# Patient Record
Sex: Male | Born: 1945
Health system: Southern US, Community
[De-identification: ages and names within clinical notes are randomized; demographics above are authoritative.]

## PROBLEM LIST (undated history)

## (undated) DIAGNOSIS — K219 Gastro-esophageal reflux disease without esophagitis: Secondary | ICD-10-CM

## (undated) DIAGNOSIS — K449 Diaphragmatic hernia without obstruction or gangrene: Secondary | ICD-10-CM

## (undated) DIAGNOSIS — N138 Other obstructive and reflux uropathy: Secondary | ICD-10-CM

## (undated) DIAGNOSIS — M5136 Other intervertebral disc degeneration, lumbar region: Secondary | ICD-10-CM

## (undated) DIAGNOSIS — G4730 Sleep apnea, unspecified: Secondary | ICD-10-CM

## (undated) DIAGNOSIS — H269 Unspecified cataract: Secondary | ICD-10-CM

## (undated) DIAGNOSIS — M1990 Unspecified osteoarthritis, unspecified site: Secondary | ICD-10-CM

## (undated) DIAGNOSIS — C61 Malignant neoplasm of prostate: Secondary | ICD-10-CM

## (undated) DIAGNOSIS — R252 Cramp and spasm: Secondary | ICD-10-CM

## (undated) DIAGNOSIS — M51369 Other intervertebral disc degeneration, lumbar region without mention of lumbar back pain or lower extremity pain: Secondary | ICD-10-CM

## (undated) DIAGNOSIS — N401 Enlarged prostate with lower urinary tract symptoms: Secondary | ICD-10-CM

## (undated) DIAGNOSIS — C801 Malignant (primary) neoplasm, unspecified: Secondary | ICD-10-CM

## (undated) DIAGNOSIS — R12 Heartburn: Secondary | ICD-10-CM

## (undated) DIAGNOSIS — E291 Testicular hypofunction: Secondary | ICD-10-CM

## (undated) HISTORY — PX: OTHER SURGICAL HISTORY: SHX169

## (undated) HISTORY — DX: Sleep apnea, unspecified: G47.30

## (undated) HISTORY — PX: APPENDECTOMY: SHX54

## (undated) HISTORY — PX: TURP VAPORIZATION: SUR1397

## (undated) HISTORY — DX: Other obstructive and reflux uropathy: N40.1

## (undated) HISTORY — DX: Testicular hypofunction: E29.1

## (undated) HISTORY — PX: PROSTATE SURGERY: SHX751

## (undated) HISTORY — DX: Unspecified osteoarthritis, unspecified site: M19.90

## (undated) HISTORY — DX: Other obstructive and reflux uropathy: N13.8

## (undated) HISTORY — PX: TONSILLECTOMY: SUR1361

## (undated) HISTORY — DX: Heartburn: R12

---

## 2013-11-08 DIAGNOSIS — K219 Gastro-esophageal reflux disease without esophagitis: Secondary | ICD-10-CM | POA: Insufficient documentation

## 2014-01-24 DIAGNOSIS — M545 Low back pain, unspecified: Secondary | ICD-10-CM | POA: Insufficient documentation

## 2014-05-21 ENCOUNTER — Ambulatory Visit: Payer: Self-pay | Admitting: Internal Medicine

## 2014-06-09 DIAGNOSIS — M5116 Intervertebral disc disorders with radiculopathy, lumbar region: Secondary | ICD-10-CM | POA: Insufficient documentation

## 2014-06-09 DIAGNOSIS — M48061 Spinal stenosis, lumbar region without neurogenic claudication: Secondary | ICD-10-CM | POA: Insufficient documentation

## 2014-11-14 DIAGNOSIS — M5136 Other intervertebral disc degeneration, lumbar region: Secondary | ICD-10-CM | POA: Insufficient documentation

## 2015-01-06 ENCOUNTER — Other Ambulatory Visit: Payer: Self-pay | Admitting: Unknown Physician Specialty

## 2015-01-06 DIAGNOSIS — K219 Gastro-esophageal reflux disease without esophagitis: Secondary | ICD-10-CM

## 2015-01-06 DIAGNOSIS — R0989 Other specified symptoms and signs involving the circulatory and respiratory systems: Secondary | ICD-10-CM

## 2015-01-08 ENCOUNTER — Ambulatory Visit
Admission: RE | Admit: 2015-01-08 | Discharge: 2015-01-08 | Disposition: A | Discharge status: Home / Self Care | Payer: Medicare Other | Source: Ambulatory Visit | Attending: Unknown Physician Specialty | Admitting: Unknown Physician Specialty

## 2015-01-08 DIAGNOSIS — K228 Other specified diseases of esophagus: Secondary | ICD-10-CM | POA: Diagnosis not present

## 2015-01-08 DIAGNOSIS — K219 Gastro-esophageal reflux disease without esophagitis: Secondary | ICD-10-CM | POA: Diagnosis not present

## 2015-01-08 DIAGNOSIS — K449 Diaphragmatic hernia without obstruction or gangrene: Secondary | ICD-10-CM | POA: Insufficient documentation

## 2015-01-08 DIAGNOSIS — J387 Other diseases of larynx: Secondary | ICD-10-CM | POA: Diagnosis present

## 2015-01-08 DIAGNOSIS — R0989 Other specified symptoms and signs involving the circulatory and respiratory systems: Secondary | ICD-10-CM

## 2015-02-11 ENCOUNTER — Encounter: Payer: Self-pay | Admitting: *Deleted

## 2015-02-12 ENCOUNTER — Ambulatory Visit: Payer: Medicare Other | Admitting: Anesthesiology

## 2015-02-12 ENCOUNTER — Ambulatory Visit
Admission: RE | Admit: 2015-02-12 | Discharge: 2015-02-12 | Disposition: A | Discharge status: Home / Self Care | Payer: Medicare Other | Source: Ambulatory Visit | Attending: Gastroenterology | Admitting: Gastroenterology

## 2015-02-12 ENCOUNTER — Encounter: Payer: Self-pay | Admitting: Anesthesiology

## 2015-02-12 ENCOUNTER — Encounter
Admission: RE | Disposition: A | Discharge status: Home / Self Care | Payer: Self-pay | Source: Ambulatory Visit | Attending: Gastroenterology

## 2015-02-12 DIAGNOSIS — Z7982 Long term (current) use of aspirin: Secondary | ICD-10-CM | POA: Insufficient documentation

## 2015-02-12 DIAGNOSIS — R1310 Dysphagia, unspecified: Secondary | ICD-10-CM | POA: Diagnosis present

## 2015-02-12 DIAGNOSIS — Z8546 Personal history of malignant neoplasm of prostate: Secondary | ICD-10-CM | POA: Insufficient documentation

## 2015-02-12 DIAGNOSIS — R252 Cramp and spasm: Secondary | ICD-10-CM | POA: Diagnosis not present

## 2015-02-12 DIAGNOSIS — Z91048 Other nonmedicinal substance allergy status: Secondary | ICD-10-CM | POA: Diagnosis not present

## 2015-02-12 DIAGNOSIS — K2960 Other gastritis without bleeding: Secondary | ICD-10-CM | POA: Insufficient documentation

## 2015-02-12 DIAGNOSIS — K219 Gastro-esophageal reflux disease without esophagitis: Secondary | ICD-10-CM | POA: Diagnosis not present

## 2015-02-12 DIAGNOSIS — K449 Diaphragmatic hernia without obstruction or gangrene: Secondary | ICD-10-CM | POA: Insufficient documentation

## 2015-02-12 DIAGNOSIS — Z883 Allergy status to other anti-infective agents status: Secondary | ICD-10-CM | POA: Diagnosis not present

## 2015-02-12 DIAGNOSIS — M1990 Unspecified osteoarthritis, unspecified site: Secondary | ICD-10-CM | POA: Insufficient documentation

## 2015-02-12 DIAGNOSIS — R12 Heartburn: Secondary | ICD-10-CM | POA: Insufficient documentation

## 2015-02-12 DIAGNOSIS — Z888 Allergy status to other drugs, medicaments and biological substances status: Secondary | ICD-10-CM | POA: Diagnosis not present

## 2015-02-12 DIAGNOSIS — K228 Other specified diseases of esophagus: Secondary | ICD-10-CM | POA: Insufficient documentation

## 2015-02-12 HISTORY — DX: Unspecified cataract: H26.9

## 2015-02-12 HISTORY — DX: Gastro-esophageal reflux disease without esophagitis: K21.9

## 2015-02-12 HISTORY — DX: Malignant neoplasm of prostate: C61

## 2015-02-12 HISTORY — DX: Malignant (primary) neoplasm, unspecified: C80.1

## 2015-02-12 HISTORY — DX: Unspecified osteoarthritis, unspecified site: M19.90

## 2015-02-12 HISTORY — DX: Cramp and spasm: R25.2

## 2015-02-12 HISTORY — PX: ESOPHAGOGASTRODUODENOSCOPY (EGD) WITH PROPOFOL: SHX5813

## 2015-02-12 SURGERY — ESOPHAGOGASTRODUODENOSCOPY (EGD) WITH PROPOFOL
Anesthesia: General

## 2015-02-12 MED ORDER — SODIUM CHLORIDE 0.9 % IV SOLN
INTRAVENOUS | Status: DC
  Administered 2015-02-12: 1000 mL via INTRAVENOUS

## 2015-02-12 MED ORDER — SODIUM CHLORIDE 0.9 % IV SOLN: INTRAVENOUS | Status: DC

## 2015-02-12 MED ORDER — ONDANSETRON HCL 4 MG/2ML IJ SOLN: 4.0000 mg | Freq: Once | INTRAMUSCULAR | Status: DC | PRN

## 2015-02-12 MED ORDER — PROPOFOL 10 MG/ML IV BOLUS
INTRAVENOUS | Status: DC | PRN
  Administered 2015-02-12: 50 mg via INTRAVENOUS

## 2015-02-12 MED ORDER — PROPOFOL INFUSION 10 MG/ML OPTIME
INTRAVENOUS | Status: DC | PRN
  Administered 2015-02-12: 140 ug/kg/min via INTRAVENOUS

## 2015-02-12 NOTE — OR Nursing (Signed)
Patient dilated with 51 savory dilator.  No heme present.

## 2015-02-12 NOTE — Anesthesia Postprocedure Evaluation (Signed)
  Anesthesia Post-op Note  Patient: Alexander Woods  Procedure(s) Performed: Procedure(s): ESOPHAGOGASTRODUODENOSCOPY (EGD) WITH PROPOFOL (N/A)  Anesthesia type:General  Patient location: PACU  Post pain: Pain level controlled  Post assessment: Post-op Vital signs reviewed, Patient's Cardiovascular Status Stable, Respiratory Function Stable, Patent Airway and No signs of Nausea or vomiting  Post vital signs: Reviewed and stable  Last Vitals:  Filed Vitals:   02/12/15 1010  BP: 129/87  Pulse: 56  Temp:   Resp: 16    Level of consciousness: awake, alert  and patient cooperative  Complications: No apparent anesthesia complications

## 2015-02-12 NOTE — Anesthesia Procedure Notes (Signed)
Date/Time: 02/12/2015 9:16 AM Performed by: Doreen Salvage Pre-anesthesia Checklist: Patient identified, Emergency Drugs available, Suction available and Patient being monitored Patient Re-evaluated:Patient Re-evaluated prior to inductionOxygen Delivery Method: Nasal cannula

## 2015-02-12 NOTE — H&P (Signed)
Primary Care Physician:  Glendon Axe, MD  Pre-Procedure History & Physical: HPI:  Alexander Woods is a 69 y.o. male is here for an endoscopy.   Past Medical History  Diagnosis Date  . GERD (gastroesophageal reflux disease)   . Cancer   . Prostate cancer   . DJD (degenerative joint disease)   . Leg cramps   . Cataract     Past Surgical History  Procedure Laterality Date  . Appendectomy    . Turp vaporization    . Incision tendon sheath for trigger finger    . Plantar fascitis     . Bleretheplasty    . Tonsillectomy    . Prostate surgery      Prior to Admission medications   Medication Sig Start Date End Date Taking? Authorizing Provider  ascorbic acid (VITAMIN C) 1000 MG tablet Take 1,000 mg by mouth daily.   Yes Historical Provider, MD  aspirin 81 MG tablet Take 81 mg by mouth daily.   Yes Historical Provider, MD  calcium carbonate (TUMS - DOSED IN MG ELEMENTAL CALCIUM) 500 MG chewable tablet Chew 1 tablet by mouth daily.   Yes Historical Provider, MD  calcium-vitamin D (OSCAL WITH D) 500-200 MG-UNIT per tablet Take 1 tablet by mouth.   Yes Historical Provider, MD  Co-Enzyme Q-10 100 MG CAPS Take by mouth.   Yes Historical Provider, MD  EPINEPHrine 0.3 mg/0.3 mL IJ SOAJ injection Inject 0.3 mg into the muscle once.   Yes Historical Provider, MD  gabapentin (NEURONTIN) 300 MG capsule Take 300 mg by mouth 3 (three) times daily.   Yes Historical Provider, MD  omeprazole (PRILOSEC) 40 MG capsule Take 40 mg by mouth daily.   Yes Historical Provider, MD  Potassium Chloride CR (MICRO-K) 8 MEQ CPCR capsule CR Take 8 mEq by mouth.   Yes Historical Provider, MD  pyridoxine (B-6) 100 MG tablet Take 100 mg by mouth daily.   Yes Historical Provider, MD  Specialty Vitamins Products (MAGNESIUM, AMINO ACID CHELATE,) 133 MG tablet Take 1 tablet by mouth 2 (two) times daily.   Yes Historical Provider, MD  tiZANidine (ZANAFLEX) 4 MG capsule Take 4 mg by mouth 3 (three) times daily.   Yes  Historical Provider, MD  vitamin B-12 (CYANOCOBALAMIN) 1000 MCG tablet Take 1,000 mcg by mouth daily.   Yes Historical Provider, MD    Allergies as of 01/26/2015  . (Not on File)    History reviewed. No pertinent family history.  Social History   Social History  . Marital Status: Married    Spouse Name: N/A  . Number of Children: N/A  . Years of Education: N/A   Occupational History  . Not on file.   Social History Main Topics  . Smoking status: Not on file  . Smokeless tobacco: Not on file  . Alcohol Use: Not on file  . Drug Use: Not on file  . Sexual Activity: Not on file   Other Topics Concern  . Not on file   Social History Narrative     Physical Exam: BP 132/96 mmHg  Pulse 75  Temp(Src) 99 F (37.2 C) (Tympanic)  Resp 16  Ht 5\' 11"  (1.803 m)  Wt 90.719 kg (200 lb)  BMI 27.91 kg/m2  SpO2 99% General:   Alert,  pleasant and cooperative in NAD Head:  Normocephalic and atraumatic. Neck:  Supple; no masses or thyromegaly. Lungs:  Clear throughout to auscultation.    Heart:  Regular rate and rhythm. Abdomen:  Soft, nontender  and nondistended. Normal bowel sounds, without guarding, and without rebound.   Neurologic:  Alert and  oriented x4;  grossly normal neurologically.  Impression/Plan: Alexander Woods is here for an endoscopy to be performed for dysphagia, GERD  Risks, benefits, limitations, and alternatives regarding  endoscopy have been reviewed with the patient.  Questions have been answered.  All parties agreeable.   Josefine Class, MD  02/12/2015, 9:14 AM

## 2015-02-12 NOTE — Transfer of Care (Signed)
Immediate Anesthesia Transfer of Care Note  Patient: Alexander Woods  Procedure(s) Performed: Procedure(s): ESOPHAGOGASTRODUODENOSCOPY (EGD) WITH PROPOFOL (N/A)  Patient Location: PACU and Endoscopy Unit  Anesthesia Type:General  Level of Consciousness: sedated  Airway & Oxygen Therapy: Patient Spontanous Breathing and Patient connected to nasal cannula oxygen  Post-op Assessment: Report given to RN and Post -op Vital signs reviewed and stable  Post vital signs: Reviewed and stable  Last Vitals:  Filed Vitals:   02/12/15 0940  BP: 115/74  Pulse: 59  Temp: 36.6 C  Resp: 14    Complications: No apparent anesthesia complications

## 2015-02-12 NOTE — Op Note (Signed)
[facility] Gastroenterology Patient Name: Alexander Woods Procedure Date: 02/12/2015 9:12 AM MRN: [id number] Account #: [id number] Date of Birth: 11-23-1945 Admit Type: Outpatient Age: 69 Room: [facility] ENDO ROOM 3 Gender: Male Note Status: Finalized Procedure:         Upper GI endoscopy Indications:       Dysphagia, Heartburn, Suspected esophageal reflux Patient Profile:   This is a 69 year old male. Providers:         Gerrit Heck. Rayann Heman, MD Referring MD:      Glendon Axe (Referring MD) Medicines:         Propofol per Anesthesia Complications:     No immediate complications. Procedure:         Pre-Anesthesia Assessment:                    - Prior to the procedure, a History and Physical was                     performed, and patient medications, allergies and                     sensitivities were reviewed. The patient's tolerance of                     previous anesthesia was reviewed.                    After obtaining informed consent, the endoscope was passed                     under direct vision. Throughout the procedure, the                     patient's blood pressure, pulse, and oxygen saturations                     were monitored continuously. The Olympus GIF-160 endoscope                     (S#. [id number]) was introduced through the mouth, and                     advanced to the second part of duodenum. The upper GI                     endoscopy was accomplished without difficulty. The patient                     tolerated the procedure well. Findings:      A medium-sized hiatus hernia was present. Estimated blood loss: none.      The examined esophagus was moderately tortuous.      A guidewire was placed and the scope was withdrawn. Dilation was       performed in the entire esophagus with a Savary dilator with no       resistance at 51 Fr.      Biopsies were taken with a cold forceps in the lower third of the       esophagus for histology.  Localized moderate mucosal changes characterized by nodularity were       found in the gastric antrum. Biopsies were taken with a cold forceps for       histology.      The examined duodenum was normal. Impression:        -  Medium-sized hiatus hernia.                    - Tortuous esophagus.                    - Nodular mucosa in the antrum. Biopsied.                    - Normal examined duodenum.                    - Dilation attempted in the entire esophagus. Successful.                    - Biopsies were taken with a cold forceps for histology in                     the lower third of the esophagus.                    - Suspect the reflux and dysphagia all related to the                     hiatal hernia. Recommendation:    - Observe patient in GI recovery unit.                    - Resume regular diet.                    - Continue present medications.                    - Use Protonix (pantoprazole) 40 mg PO daily. Stop                     prilosec.                    - Await pathology results.                    - Perform esophageal manomettry and 24 hour pH study.                    - Consider hernina repair if pH study confirms GERD and                     manometry is favorable and no improvement on protonix.                    - Return to GI clinic.                    - The findings and recommendations were discussed with the                     patient.                    - The findings and recommendations were discussed with the                     patient's family. Procedure Code(s): --- Professional ---                    [phone], Esophagogastroduodenoscopy, flexible, transoral;                     with insertion of guide wire followed by  passage of                     dilator(s) through esophagus over guide wire                    [id number], Esophagogastroduodenoscopy, flexible, transoral;                     with biopsy, single or multiple CPT copyright 2014 American  Medical Association. All rights reserved. The codes documented in this report are preliminary and upon coder review may  be revised to meet current compliance requirements. Mellody Life, MD 02/12/2015 9:33:46 AM This report has been signed electronically. Number of Addenda: 0 Note Initiated On: 02/12/2015 9:12 AM      [facility]

## 2015-02-12 NOTE — Discharge Instructions (Signed)

## 2015-02-12 NOTE — Anesthesia Preprocedure Evaluation (Signed)
Anesthesia Evaluation  Patient identified by MRN, date of birth, ID band Patient awake    Reviewed: Allergy & Precautions, NPO status , Patient's Chart, lab work & pertinent test results  Airway Mallampati: II  TM Distance: >3 FB     Dental  (+) Caps Top front caps:   Pulmonary neg pulmonary ROS,    Pulmonary exam normal breath sounds clear to auscultation       Cardiovascular Normal cardiovascular exam     Neuro/Psych negative neurological ROS  negative psych ROS   GI/Hepatic Neg liver ROS, GERD  Medicated and Controlled,dysphagia   Endo/Other  negative endocrine ROS  Renal/GU negative Renal ROS     Musculoskeletal  (+) Arthritis , Osteoarthritis,    Abdominal Normal abdominal exam  (+)   Peds negative pediatric ROS (+)  Hematology negative hematology ROS (+)   Anesthesia Other Findings Prostate CA  Reproductive/Obstetrics                             Anesthesia Physical Anesthesia Plan  ASA: II  Anesthesia Plan: General   Post-op Pain Management:    Induction: Intravenous  Airway Management Planned: Nasal Cannula  Additional Equipment:   Intra-op Plan:   Post-operative Plan:   Informed Consent: I have reviewed the patients History and Physical, chart, labs and discussed the procedure including the risks, benefits and alternatives for the proposed anesthesia with the patient or authorized representative who has indicated his/her understanding and acceptance.   Dental advisory given  Plan Discussed with: CRNA and Surgeon  Anesthesia Plan Comments:         Anesthesia Quick Evaluation

## 2015-02-13 ENCOUNTER — Encounter: Payer: Self-pay | Admitting: Gastroenterology

## 2015-02-16 LAB — SURGICAL PATHOLOGY

## 2015-03-31 DIAGNOSIS — Z8639 Personal history of other endocrine, nutritional and metabolic disease: Secondary | ICD-10-CM | POA: Insufficient documentation

## 2015-04-14 ENCOUNTER — Encounter: Payer: Self-pay | Admitting: *Deleted

## 2015-04-15 ENCOUNTER — Encounter
Admission: RE | Disposition: A | Discharge status: Home / Self Care | Payer: Self-pay | Source: Ambulatory Visit | Attending: Gastroenterology

## 2015-04-15 ENCOUNTER — Ambulatory Visit
Admission: RE | Admit: 2015-04-15 | Discharge: 2015-04-15 | Disposition: A | Discharge status: Home / Self Care | Payer: Medicare Other | Source: Ambulatory Visit | Attending: Gastroenterology | Admitting: Gastroenterology

## 2015-04-15 DIAGNOSIS — R1310 Dysphagia, unspecified: Secondary | ICD-10-CM | POA: Diagnosis not present

## 2015-04-15 DIAGNOSIS — Z79899 Other long term (current) drug therapy: Secondary | ICD-10-CM | POA: Insufficient documentation

## 2015-04-15 DIAGNOSIS — K219 Gastro-esophageal reflux disease without esophagitis: Secondary | ICD-10-CM | POA: Diagnosis not present

## 2015-04-15 DIAGNOSIS — R1110 Vomiting, unspecified: Secondary | ICD-10-CM | POA: Insufficient documentation

## 2015-04-15 HISTORY — DX: Other intervertebral disc degeneration, lumbar region: M51.36

## 2015-04-15 HISTORY — PX: ESOPHAGEAL MANOMETRY: SHX5429

## 2015-04-15 HISTORY — PX: 24 HOUR PH STUDY: SHX5419

## 2015-04-15 HISTORY — DX: Other intervertebral disc degeneration, lumbar region without mention of lumbar back pain or lower extremity pain: M51.369

## 2015-04-15 HISTORY — DX: Diaphragmatic hernia without obstruction or gangrene: K44.9

## 2015-04-15 SURGERY — MANOMETRY, ESOPHAGUS
Anesthesia: General

## 2015-04-15 MED ORDER — LIDOCAINE HCL 2 % EX GEL
CUTANEOUS | Status: AC
  Administered 2015-04-15: 3
  Filled 2015-04-15: qty 5

## 2015-04-15 MED ORDER — BUTAMBEN-TETRACAINE-BENZOCAINE 2-2-14 % EX AERO
INHALATION_SPRAY | CUTANEOUS | Status: AC
  Administered 2015-04-15: 1 via TOPICAL
  Filled 2015-04-15: qty 20

## 2015-04-15 MED ORDER — BUTAMBEN-TETRACAINE-BENZOCAINE 2-2-14 % EX AERO
1.0000 | INHALATION_SPRAY | Freq: Once | CUTANEOUS | Status: AC
  Administered 2015-04-15: 1 via TOPICAL

## 2015-04-15 MED ORDER — LIDOCAINE HCL 2 % EX GEL
1.0000 "application " | Freq: Once | CUTANEOUS | Status: AC
  Administered 2015-04-15: 3

## 2015-04-15 SURGICAL SUPPLY — 2 items
FACESHIELD LNG OPTICON STERILE (SAFETY) IMPLANT
GLOVE BIO SURGEON STRL SZ8 (GLOVE) ×6 IMPLANT

## 2015-04-20 ENCOUNTER — Encounter: Payer: Self-pay | Admitting: Gastroenterology

## 2015-10-12 ENCOUNTER — Encounter: Payer: Self-pay | Admitting: *Deleted

## 2015-10-13 ENCOUNTER — Ambulatory Visit (INDEPENDENT_AMBULATORY_CARE_PROVIDER_SITE_OTHER): Payer: Medicare Other | Admitting: Urology

## 2015-10-13 ENCOUNTER — Encounter: Payer: Self-pay | Admitting: Urology

## 2015-10-13 VITALS — BP 120/78 | HR 75 | Ht 71.0 in | Wt 198.8 lb

## 2015-10-13 DIAGNOSIS — N401 Enlarged prostate with lower urinary tract symptoms: Secondary | ICD-10-CM | POA: Diagnosis not present

## 2015-10-13 DIAGNOSIS — N138 Other obstructive and reflux uropathy: Secondary | ICD-10-CM | POA: Insufficient documentation

## 2015-10-13 DIAGNOSIS — Z8546 Personal history of malignant neoplasm of prostate: Secondary | ICD-10-CM | POA: Diagnosis not present

## 2015-10-13 NOTE — Progress Notes (Signed)
10/13/2015 8:58 AM   Alexander Woods 1945/07/21 [id number]  Referring provider: Glendon Axe, MD [facility] [place], [place] [address]  Chief Complaint  Patient presents with  . Prostate Cancer    1 year follow up    HPI: Patient is a 70 year old Caucasian male with prostate cancer and BPH with LUTS who presents today for a yearly follow up.  History of prostate cancer Patient underwent TURP on 01/17/2007 by Dr. Tana Conch Mynatt at [facility] and 2 chips returned with focal adenocarcinoma, Gleason 2 + 3=5. Most recent PSA was 2.1 ng/mL on 10/17/2014.    BPH WITH LUTS His IPSS score today is 2, which is mild lower urinary tract symptomatology. He is delighted with his quality life due to his urinary symptoms.  He denies any dysuria, hematuria or suprapubic pain.  His has had TURP on 01/17/2007.   He also denies any recent fevers, chills, nausea or vomiting.  He does not have a family history of PCa.      IPSS      10/13/15 0800       International Prostate Symptom Score   How often have you had the sensation of not emptying your bladder? Not at All     How often have you had to urinate less than every two hours? Less than 1 in 5 times     How often have you found you stopped and started again several times when you urinated? Not at All     How often have you found it difficult to postpone urination? Not at All     How often have you had a weak urinary stream? Not at All     How often have you had to strain to start urination? Not at All     How many times did you typically get up at night to urinate? 1 Time     Total IPSS Score 2     Quality of Life due to urinary symptoms   If you were to spend the rest of your life with your urinary condition just the way it is now how would you feel about that? Delighted        Score:  1-7 Mild 8-19 Moderate 20-35 Severe     PMH: Past Medical History  Diagnosis Date  . GERD  (gastroesophageal reflux disease)   . Cancer ([place])   . Prostate cancer ([place])   . DJD (degenerative joint disease)   . Leg cramps   . Cataract   . DDD (degenerative disc disease), lumbar   . HH (hiatus hernia)   . Heartburn   . Arthritis   . Sleep apnea   . BPH with obstruction/lower urinary tract symptoms   . Hypogonadism in male     Surgical History: Past Surgical History  Procedure Laterality Date  . Appendectomy    . Turp vaporization    . Incision tendon sheath for trigger finger    . Plantar fascitis     . Bleretheplasty    . Tonsillectomy    . Prostate surgery    . Esophagogastroduodenoscopy (egd) with propofol N/A 02/12/2015    Procedure: ESOPHAGOGASTRODUODENOSCOPY (EGD) WITH PROPOFOL;  Surgeon: Josefine Class, MD;  [facility]COPY;  Service: Endoscopy;  Laterality: N/A;  . Esophageal manometry N/A 04/15/2015    Procedure: ESOPHAGEAL MANOMETRY (EM);  Surgeon: Josefine Class, MD;  Location: [facility] ENDOSCOPY;  Service: Endoscopy;  Laterality: N/A;  .  24 hour ph study N/A 04/15/2015    Procedure: 24 HOUR PH STUDY;  Surgeon: Josefine Class, MD;  Location: [facility] ENDOSCOPY;  Service: Endoscopy;  Laterality: N/A;    Home Medications:    Medication List       This list is accurate as of: 10/13/15  8:58 AM.  Always use your most recent med list.               ascorbic acid 1000 MG tablet  Commonly known as:  VITAMIN C  Take 1,000 mg by mouth daily.     aspirin 81 MG tablet  Take 81 mg by mouth daily.     calcium carbonate 500 MG chewable tablet  Commonly known as:  TUMS - dosed in mg elemental calcium  Chew 1 tablet by mouth daily.     calcium-vitamin D 500-200 MG-UNIT tablet  Commonly known as:  OSCAL WITH D  Take 1 tablet by mouth. Reported on 10/13/2015     Co-Enzyme Q-10 100 MG Caps  Take by mouth. Reported on 10/13/2015     EPINEPHrine 0.3 mg/0.3 mL Soaj injection  Commonly known as:  EPI-PEN  Inject 0.3 mg into the muscle once.      magnesium (amino acid chelate) 133 MG tablet  Take 1 tablet by mouth 2 (two) times daily. Reported on 10/13/2015     Magnesium 250 MG Tabs  Take by mouth.     MULTI-VITAMINS Tabs  Take by mouth.     NEURONTIN 300 MG capsule  Generic drug:  gabapentin  Take 300 mg by mouth 3 (three) times daily. Reported on 10/13/2015     OVER THE COUNTER MEDICATION  Cinsulin     pantoprazole 40 MG tablet  Commonly known as:  PROTONIX  Take by mouth. Reported on 10/13/2015     Potassium 99 MG Tabs  Take by mouth.     Potassium Chloride CR 8 MEQ Cpcr capsule CR  Commonly known as:  MICRO-K  Take 8 mEq by mouth. Reported on 10/13/2015     predniSONE 5 MG tablet  Commonly known as:  DELTASONE  Reported on 10/13/2015     PRILOSEC 40 MG capsule  Generic drug:  omeprazole  Take 40 mg by mouth daily. Reported on 10/13/2015     pyridoxine 100 MG tablet  Commonly known as:  B-6  Take 100 mg by mouth daily.     vitamin B-12 1000 MCG tablet  Commonly known as:  CYANOCOBALAMIN  Take 1,000 mcg by mouth daily.     Vitamin D3 2000 units capsule  Take by mouth.     ZANAFLEX 4 MG capsule  Generic drug:  tiZANidine  Take 4 mg by mouth 3 (three) times daily.        Allergies:  Allergies  Allergen Reactions  . Bacitracin   . Bee Venom Other (See Comments)  . Ciprofloxacin   . Erythromycin Ethylsuccinate   . Fentanyl Citrate   . Monosodium Glutamate     Family History: Family History  Problem Relation Age of Onset  . Stroke Father   . Heart disease Mother     Aortic Tear  . Heart failure Mother   . Kidney disease Neg Hx   . Prostate cancer Neg Hx     Social History:  reports that he has never smoked. He has never used smokeless tobacco. He reports that he does not drink alcohol or use illicit drugs.  ROS: UROLOGY Frequent Urination?: No Hard to  postpone urination?: No Burning/pain with urination?: No Get up at night to urinate?: Yes Leakage of urine?: No Urine stream starts and  stops?: No Trouble starting stream?: No Do you have to strain to urinate?: No Blood in urine?: No Urinary tract infection?: No Sexually transmitted disease?: No Injury to kidneys or bladder?: No Painful intercourse?: No Weak stream?: No Erection problems?: No Penile pain?: No  Gastrointestinal Nausea?: No Vomiting?: No Indigestion/heartburn?: Yes Diarrhea?: No Constipation?: No  Constitutional Fever: No Night sweats?: No Weight loss?: No Fatigue?: No  Skin Skin rash/lesions?: No Itching?: No  Eyes Blurred vision?: No Double vision?: No  Ears/Nose/Throat Sore throat?: No Sinus problems?: No  Hematologic/Lymphatic Swollen glands?: No Easy bruising?: No  Cardiovascular Leg swelling?: Yes Chest pain?: No  Respiratory Cough?: No Shortness of breath?: No  Endocrine Excessive thirst?: No  Musculoskeletal Back pain?: Yes Joint pain?: No  Neurological Headaches?: No Dizziness?: No  Psychologic Depression?: No Anxiety?: No  Physical Exam: BP 120/78 mmHg  Pulse 75  Ht 5\' 11"  (1.803 m)  Wt 198 lb 12.8 oz (90.175 kg)  BMI 27.74 kg/m2  Constitutional: Well nourished. Alert and oriented, No acute distress. HEENT: [place] AT, moist mucus membranes. Trachea midline, no masses. Cardiovascular: No clubbing, cyanosis, or edema. Respiratory: Normal respiratory effort, no increased work of breathing. GI: Abdomen is soft, non tender, non distended, no abdominal masses. Liver and spleen not palpable.  No hernias appreciated.  Stool sample for occult testing is not indicated.   GU: No CVA tenderness.  No bladder fullness or masses.  Patient with circumcised phallus.  Urethral meatus is patent.  No penile discharge. No penile lesions or rashes. Scrotum without lesions, cysts, rashes and/or edema.  Testicles are located scrotally bilaterally. No masses are appreciated in the testicles. Left and right epididymis are normal. Rectal: Patient with  normal sphincter tone.  Anus and perineum without scarring or rashes. No rectal masses are appreciated. Prostate is approximately 50 grams, no nodules are appreciated. Seminal vesicles are normal. Skin: No rashes, bruises or suspicious lesions. Lymph: No cervical or inguinal adenopathy. Neurologic: Grossly intact, no focal deficits, moving all 4 extremities. Psychiatric: Normal mood and affect.  Laboratory Data: PSA History:  6.5 ng/mL on 05/26/2005  5.3 ng/mL on 06/16/2005  3.7 ng/mL on 12/06/2006  1.2 ng/mL on 05/02/2007  1.6 ng/mL on 10/31/2007  1.3 ng/mL on 10/22/2008  1.6 ng/mL on 04/22/2009  1.8 ng/mL on 10/28/2009  1.8 ng/mL on 05/05/2010  1.8 ng/mL on 12/22/2011  1.8 ng/mL on 12/20/2012  2.0 ng/mL on 10/16/2013  2.1 ng/mL on 10/17/2014   Assessment & Plan:    1. History of prostate cancer:   Patient underwent TURP on 01/17/2007 by Dr. Tana Conch Mynatt at [facility] and 2 chips returned with focal adenocarcinoma, Gleason 2 + 3=5. Most recent PSA was 2.1 ng/mL on 10/17/2014.  If PSA remains stable, we will see him in one year.   - PSA  2. BPH with LUTS:    IPSS score is 2/0.  We will continue to monitor.  He will have his IPSS score, exam and PSA in 12 months if PSA remains stable.    Return in about 1 year (around 10/12/2016) for IPSS and exam.  These notes generated with voice recognition software. I apologize for typographical errors.  Zara Council, [facility] [address], [place], [place] [address] [phone]

## 2015-10-14 LAB — PSA: Prostate Specific Ag, Serum: 2.3 ng/mL (ref 0.0–4.0)

## 2015-10-15 ENCOUNTER — Telehealth: Payer: Self-pay

## 2015-10-15 NOTE — Telephone Encounter (Signed)
-----   Message from Nori Riis, PA-C sent at 10/14/2015  8:30 AM EDT ----- PSA is stable.  We will see him next year.

## 2015-10-15 NOTE — Telephone Encounter (Signed)
Spoke with pt in reference to PSA results. Pt voiced understanding.  

## 2015-10-19 ENCOUNTER — Encounter: Payer: Self-pay | Admitting: Podiatry

## 2015-10-19 ENCOUNTER — Ambulatory Visit (INDEPENDENT_AMBULATORY_CARE_PROVIDER_SITE_OTHER): Payer: Medicare Other

## 2015-10-19 ENCOUNTER — Ambulatory Visit (INDEPENDENT_AMBULATORY_CARE_PROVIDER_SITE_OTHER): Payer: Medicare Other | Admitting: Podiatry

## 2015-10-19 VITALS — BP 138/69 | HR 62 | Resp 12

## 2015-10-19 DIAGNOSIS — M79672 Pain in left foot: Secondary | ICD-10-CM

## 2015-10-19 DIAGNOSIS — G5762 Lesion of plantar nerve, left lower limb: Secondary | ICD-10-CM

## 2015-10-19 DIAGNOSIS — G5782 Other specified mononeuropathies of left lower limb: Secondary | ICD-10-CM

## 2015-10-19 NOTE — Progress Notes (Signed)
   Subjective:    Patient ID: Alexander Woods, male    DOB: 1945/11/27, 70 y.o.   MRN: [id number]  HPI: He presents today with year duration of pain to the fourth intermetatarsal space of the left foot. He states that he has been told that he had a neuroma and had a cortisone injection to the left foot which alleviated his symptoms for short period time but now it's back and excruciating. He states it hurts anytime that he is on his foot when he is off his foot he really doesn't notice the pain. He states that he does notice the tire the shoe gear the worst the pain is. He denies any trauma to the area.    Review of Systems  HENT: Positive for sinus pressure.   Cardiovascular: Positive for leg swelling.  Musculoskeletal: Positive for back pain and joint swelling.  Skin: Positive for color change.  Allergic/Immunologic: Positive for food allergies.       Objective:   Physical Exam: Vital signs are stable alert and oriented 3. Pulses are palpable. Neurologic sensorium is intact. Palpable Mulder's click with a mass noted between the fourth and fifth metatarsals of the left foot. Orthopedic evaluation demonstrates rectus foot type with a major osseous abnormalities. Deep tendon reflexes are intact and muscle strength is normal. Radiographs do not demonstrates any type of osseus abnormalities in this area.        Assessment & Plan:  Assessment: Neuroma fourth interdigital space left foot.  Plan: Because he is already had a cortisone injection we started with the dehydrated alcohol injections today we discussed this in great detail versus surgery. He understands this and is amenable to it I will follow-up with him in 3 weeks.

## 2015-10-26 ENCOUNTER — Telehealth: Payer: Self-pay

## 2015-10-26 NOTE — Telephone Encounter (Signed)
I would suggest the patient have an UA and a culture to rule out an infection as a source of his infection.

## 2015-10-26 NOTE — Telephone Encounter (Signed)
Pt called stating since Thursday of last week he has been passing clots upon urination. Pt states that he is not in any pain and his urine flow is not obstructed from the clots, but he is concerned. Please advise.

## 2015-10-27 ENCOUNTER — Other Ambulatory Visit: Payer: Medicare Other

## 2015-10-27 DIAGNOSIS — R319 Hematuria, unspecified: Secondary | ICD-10-CM

## 2015-10-27 LAB — URINALYSIS, COMPLETE
Bilirubin, UA: NEGATIVE
Glucose, UA: NEGATIVE
Ketones, UA: NEGATIVE
Leukocytes, UA: NEGATIVE
NITRITE UA: NEGATIVE
PH UA: 5.5 (ref 5.0–7.5)
Specific Gravity, UA: >1.03 — ABNORMAL HIGH (ref 1.005–1.030)
UUROB: 0.2 mg/dL (ref 0.2–1.0)

## 2015-10-27 LAB — MICROSCOPIC EXAMINATION
BACTERIA UA: NONE SEEN
WBC UA: NONE SEEN /HPF (ref 0–?)

## 2015-10-27 NOTE — Telephone Encounter (Signed)
Amber set pt up for lab visit today.

## 2015-10-28 ENCOUNTER — Other Ambulatory Visit: Payer: Self-pay | Admitting: Student

## 2015-10-28 DIAGNOSIS — M25461 Effusion, right knee: Secondary | ICD-10-CM

## 2015-10-28 DIAGNOSIS — M25861 Other specified joint disorders, right knee: Secondary | ICD-10-CM

## 2015-10-28 DIAGNOSIS — M25561 Pain in right knee: Secondary | ICD-10-CM

## 2015-10-30 ENCOUNTER — Telehealth: Payer: Self-pay

## 2015-10-30 NOTE — Telephone Encounter (Signed)
LMOM

## 2015-10-30 NOTE — Telephone Encounter (Signed)
Pt called requesting ucx results. Made aware the results are not back yet. Pt stated that he is starting to get concerned as he is still passing clots. Please advise.

## 2015-10-30 NOTE — Telephone Encounter (Signed)
Is he having hematuria or just clots?  He needs to keep his fluid intake up as this will keep the clots small and thin,  If he has difficulty passing his urine over the weekend, he needs to seek care in the ED.  If he signs up for My Chart, we can communicate over the weekend.  Or he can contact his pharmacy to see if a script has been sent in.

## 2015-11-03 NOTE — Telephone Encounter (Signed)
If the urine culture final report returns as no infection.  We should pursue a hematuria workup.

## 2015-11-03 NOTE — Telephone Encounter (Signed)
LMOM

## 2015-11-03 NOTE — Telephone Encounter (Signed)
Spoke with pt in reference to gross hematuria. Pt stated that as of Saturday he has not had any more episodes.

## 2015-11-03 NOTE — Telephone Encounter (Signed)
-----   Message from Nori Riis, PA-C sent at 11/03/2015 11:14 AM EDT ----- Patient's urine culture is still in preliminary status.  At this time, it does not appear that he has an infection.  Is he still having blood in his urine?

## 2015-11-04 ENCOUNTER — Telehealth: Payer: Self-pay

## 2015-11-04 NOTE — Telephone Encounter (Signed)
Pt called wanting to know if his official ucx results have come back. Made pt aware results were negative. Pt stated that since Saturday the blood has not returned. Reinforced with pt should blood return to call us back.

## 2015-11-06 LAB — CULTURE, URINE COMPREHENSIVE

## 2015-11-06 NOTE — Telephone Encounter (Signed)
-----   Message from Nori Riis, PA-C sent at 11/06/2015 12:53 PM EDT ----- Urine culture is negative.  Proceed with CT Urogram and cystoscopy.

## 2015-11-06 NOTE — Telephone Encounter (Signed)
Spoke with pt in reference to -ucx. Made aware to proceed with CT and cysto. Pt voiced understanding.

## 2015-11-09 ENCOUNTER — Encounter: Payer: Self-pay | Admitting: Podiatry

## 2015-11-09 ENCOUNTER — Ambulatory Visit (INDEPENDENT_AMBULATORY_CARE_PROVIDER_SITE_OTHER): Payer: Medicare Other | Admitting: Podiatry

## 2015-11-09 VITALS — BP 109/70 | HR 78 | Resp 12

## 2015-11-09 DIAGNOSIS — G5782 Other specified mononeuropathies of left lower limb: Secondary | ICD-10-CM

## 2015-11-09 DIAGNOSIS — G5762 Lesion of plantar nerve, left lower limb: Secondary | ICD-10-CM | POA: Diagnosis not present

## 2015-11-09 NOTE — Progress Notes (Signed)
He presents today after his initial dose of dehydrated alcohol to the fourth interdigital space of the left foot. He states that it is no better.  Objective: Vital signs are stable he is alert and oriented 3 he has severe pain on palpation with a palpable Mulder's click to the fourth interdigital space of the left foot.  Assessment: Neuroma fourth interdigital space left foot.  Plan: I injected his fourth intermetatarsal space today with dehydrated alcohol and I will follow-up with him for his third dose in 3 weeks.

## 2015-11-11 ENCOUNTER — Ambulatory Visit
Admission: RE | Admit: 2015-11-11 | Discharge: 2015-11-11 | Disposition: A | Discharge status: Home / Self Care | Payer: Medicare Other | Source: Ambulatory Visit | Attending: Student | Admitting: Student

## 2015-11-11 DIAGNOSIS — M948X6 Other specified disorders of cartilage, lower leg: Secondary | ICD-10-CM | POA: Insufficient documentation

## 2015-11-11 DIAGNOSIS — M25861 Other specified joint disorders, right knee: Secondary | ICD-10-CM | POA: Diagnosis present

## 2015-11-11 DIAGNOSIS — M25561 Pain in right knee: Secondary | ICD-10-CM | POA: Diagnosis not present

## 2015-11-11 DIAGNOSIS — M25461 Effusion, right knee: Secondary | ICD-10-CM | POA: Insufficient documentation

## 2015-11-11 DIAGNOSIS — S83241A Other tear of medial meniscus, current injury, right knee, initial encounter: Secondary | ICD-10-CM | POA: Diagnosis not present

## 2015-11-30 ENCOUNTER — Ambulatory Visit (INDEPENDENT_AMBULATORY_CARE_PROVIDER_SITE_OTHER): Payer: Medicare Other | Admitting: Podiatry

## 2015-11-30 ENCOUNTER — Encounter: Payer: Self-pay | Admitting: Podiatry

## 2015-11-30 DIAGNOSIS — G5762 Lesion of plantar nerve, left lower limb: Secondary | ICD-10-CM | POA: Diagnosis not present

## 2015-11-30 DIAGNOSIS — G5782 Other specified mononeuropathies of left lower limb: Secondary | ICD-10-CM

## 2015-11-30 NOTE — Progress Notes (Signed)
He presents today for follow-up of neuroma to the third interdigital space of the left foot. He states he is doing quite well proximally 75% improved.  Objective: Vital signs are stable alert and oriented 3. Pulses are palpable. Palpable Mulder's click to the third interdigital space of the left foot with pain.  Assessment: Neuroma third interspace left foot.  Plan: Injected another dose of dehydrated alcohol today and will follow up with him as needed. He is about to have surgery to the right knee and we will follow up with him once this is correct.

## 2015-12-09 ENCOUNTER — Encounter
Admission: RE | Admit: 2015-12-09 | Discharge: 2015-12-09 | Disposition: A | Discharge status: Home / Self Care | Payer: Medicare Other | Source: Ambulatory Visit | Attending: Surgery | Admitting: Surgery

## 2015-12-09 ENCOUNTER — Ambulatory Visit
Admission: RE | Admit: 2015-12-09 | Discharge: 2015-12-09 | Disposition: A | Discharge status: Home / Self Care | Payer: Medicare Other | Source: Ambulatory Visit | Attending: Surgery | Admitting: Surgery

## 2015-12-09 ENCOUNTER — Ambulatory Visit: Admission: RE | Admit: 2015-12-09 | Payer: Medicare Other | Source: Ambulatory Visit

## 2015-12-09 DIAGNOSIS — Z01818 Encounter for other preprocedural examination: Secondary | ICD-10-CM

## 2015-12-09 DIAGNOSIS — M1711 Unilateral primary osteoarthritis, right knee: Secondary | ICD-10-CM | POA: Insufficient documentation

## 2015-12-09 DIAGNOSIS — Z01810 Encounter for preprocedural cardiovascular examination: Secondary | ICD-10-CM | POA: Diagnosis not present

## 2015-12-09 LAB — URINALYSIS COMPLETE WITH MICROSCOPIC (ARMC ONLY)
BILIRUBIN URINE: NEGATIVE
Bacteria, UA: NONE SEEN
Glucose, UA: NEGATIVE mg/dL
Hgb urine dipstick: NEGATIVE
KETONES UR: NEGATIVE mg/dL
LEUKOCYTES UA: NEGATIVE
Nitrite: NEGATIVE
PH: 5 (ref 5.0–8.0)
Protein, ur: NEGATIVE mg/dL
Specific Gravity, Urine: 1.017 (ref 1.005–1.030)
Squamous Epithelial / LPF: NONE SEEN

## 2015-12-09 LAB — TYPE AND SCREEN
ABO/RH(D): A POS
Antibody Screen: NEGATIVE

## 2015-12-09 LAB — PROTIME-INR
INR: 1.03
Prothrombin Time: 13.7 seconds (ref 11.4–15.0)

## 2015-12-09 LAB — SURGICAL PCR SCREEN
MRSA, PCR: NEGATIVE
Staphylococcus aureus: NEGATIVE

## 2015-12-09 NOTE — Patient Instructions (Signed)
Your procedure is scheduled on: 12/17/15 THURSDAY Report to Day Surgery. 2ND FLOOR MEDICAL MALL ENTRANCE To find out your arrival time please call [phone] between 1PM - 3PM on Wednesday 12/16/15.  Remember: Instructions that are not followed completely may result in serious medical risk, up to and including death, or upon the discretion of your surgeon and anesthesiologist your surgery may need to be rescheduled.    __X__ 1. Do not eat food or drink liquids after midnight. No gum chewing or hard candies.     __X__ 2. No Alcohol for 24 hours before or after surgery.   ____ 3. Bring all medications with you on the day of surgery if instructed.    __X__ 4. Notify your doctor if there is any change in your medical condition     (cold, fever, infections).     Do not wear jewelry, make-up, hairpins, clips or nail polish.  Do not wear lotions, powders, or perfumes.   Do not shave 48 hours prior to surgery. Men may shave face and neck.  Do not bring valuables to the hospital.    [facility] is not responsible for any belongings or valuables.               Contacts, dentures or bridgework may not be worn into surgery.  Leave your suitcase in the car. After surgery it may be brought to your room.  For patients admitted to the hospital, discharge time is determined by your                treatment team.   Patients discharged the day of surgery will not be allowed to drive home.   Please read over the following fact sheets that you were given:   MRSA Information and Surgical Site Infection Prevention   __X__ Take these medicines the morning of surgery with A SIP OF WATER:    1. PROTONIX  2.   3.   4.  5.  6.  ____ Fleet Enema (as directed)   __X__ Use CHG Soap as directed  ____ Use inhalers on the day of surgery  ____ Stop metformin 2 days prior to surgery    ____ Take 1/2 of usual insulin dose the night before surgery and none on the morning of surgery.   __X__ Stop  Coumadin/Plavix/aspirin on ALREADY STOPPED  ____ Stop Anti-inflammatories on    __X__ Stop supplements until after surgery. ALREADY STOPPED   ____ Bring C-Pap to the hospital.

## 2015-12-17 ENCOUNTER — Inpatient Hospital Stay: Payer: Medicare Other

## 2015-12-17 ENCOUNTER — Ambulatory Visit: Payer: Medicare Other | Admitting: Anesthesiology

## 2015-12-17 ENCOUNTER — Inpatient Hospital Stay
Admission: AD | Admit: 2015-12-17 | Discharge: 2015-12-18 | DRG: 470 | Disposition: A | Discharge status: Home / Self Care | Payer: Medicare Other | Source: Ambulatory Visit | Attending: Surgery | Admitting: Surgery

## 2015-12-17 ENCOUNTER — Encounter
Admission: AD | Disposition: A | Discharge status: Home / Self Care | Payer: Self-pay | Source: Ambulatory Visit | Attending: Surgery

## 2015-12-17 ENCOUNTER — Encounter: Payer: Self-pay | Admitting: *Deleted

## 2015-12-17 DIAGNOSIS — N401 Enlarged prostate with lower urinary tract symptoms: Secondary | ICD-10-CM | POA: Diagnosis present

## 2015-12-17 DIAGNOSIS — K219 Gastro-esophageal reflux disease without esophagitis: Secondary | ICD-10-CM | POA: Diagnosis present

## 2015-12-17 DIAGNOSIS — G4730 Sleep apnea, unspecified: Secondary | ICD-10-CM | POA: Diagnosis present

## 2015-12-17 DIAGNOSIS — N138 Other obstructive and reflux uropathy: Secondary | ICD-10-CM | POA: Diagnosis present

## 2015-12-17 DIAGNOSIS — M5136 Other intervertebral disc degeneration, lumbar region: Secondary | ICD-10-CM | POA: Diagnosis present

## 2015-12-17 DIAGNOSIS — Z8546 Personal history of malignant neoplasm of prostate: Secondary | ICD-10-CM | POA: Diagnosis not present

## 2015-12-17 DIAGNOSIS — H25019 Cortical age-related cataract, unspecified eye: Secondary | ICD-10-CM | POA: Diagnosis present

## 2015-12-17 DIAGNOSIS — K449 Diaphragmatic hernia without obstruction or gangrene: Secondary | ICD-10-CM | POA: Diagnosis present

## 2015-12-17 DIAGNOSIS — Z96651 Presence of right artificial knee joint: Secondary | ICD-10-CM

## 2015-12-17 DIAGNOSIS — M1711 Unilateral primary osteoarthritis, right knee: Principal | ICD-10-CM | POA: Diagnosis present

## 2015-12-17 DIAGNOSIS — E291 Testicular hypofunction: Secondary | ICD-10-CM | POA: Diagnosis present

## 2015-12-17 DIAGNOSIS — H269 Unspecified cataract: Secondary | ICD-10-CM | POA: Diagnosis present

## 2015-12-17 HISTORY — PX: PARTIAL KNEE ARTHROPLASTY: SHX2174

## 2015-12-17 SURGERY — ARTHROPLASTY, KNEE, UNICOMPARTMENTAL
Anesthesia: Spinal | Laterality: Right

## 2015-12-17 MED ORDER — KCL IN DEXTROSE-NACL 20-5-0.9 MEQ/L-%-% IV SOLN
INTRAVENOUS | Status: DC
  Administered 2015-12-17 – 2015-12-18 (×2): via INTRAVENOUS
  Filled 2015-12-17 (×4): qty 1000

## 2015-12-17 MED ORDER — ASPIRIN 81 MG PO CHEW
81.0000 mg | CHEWABLE_TABLET | Freq: Every day | ORAL | Status: DC
  Administered 2015-12-17 – 2015-12-18 (×2): 81 mg via ORAL
  Filled 2015-12-17 (×2): qty 1

## 2015-12-17 MED ORDER — VITAMIN D 1000 UNITS PO TABS
2000.0000 [IU] | ORAL_TABLET | Freq: Every day | ORAL | Status: DC
  Administered 2015-12-18: 2000 [IU] via ORAL
  Filled 2015-12-17 (×3): qty 2

## 2015-12-17 MED ORDER — SODIUM CHLORIDE 0.9 % IJ SOLN
INTRAMUSCULAR | Status: AC
Start: 2015-12-17 — End: 2015-12-17
  Filled 2015-12-17: qty 50

## 2015-12-17 MED ORDER — PANTOPRAZOLE SODIUM 40 MG PO TBEC
40.0000 mg | DELAYED_RELEASE_TABLET | Freq: Two times a day (BID) | ORAL | Status: DC
  Administered 2015-12-18: 40 mg via ORAL
  Filled 2015-12-17 (×2): qty 1

## 2015-12-17 MED ORDER — TRANEXAMIC ACID 1000 MG/10ML IV SOLN
1000.0000 mg | INTRAVENOUS | Status: DC | PRN
  Administered 2015-12-17: 1000 mg via INTRAVENOUS

## 2015-12-17 MED ORDER — ONDANSETRON HCL 4 MG/2ML IJ SOLN
4.0000 mg | Freq: Four times a day (QID) | INTRAMUSCULAR | Status: DC | PRN
  Administered 2015-12-18: 4 mg via INTRAVENOUS
  Filled 2015-12-17: qty 2

## 2015-12-17 MED ORDER — MAGNESIUM 250 MG PO TABS: 1.0000 | ORAL_TABLET | Freq: Every day | ORAL | Status: DC

## 2015-12-17 MED ORDER — ACETAMINOPHEN 650 MG RE SUPP: 650.0000 mg | Freq: Four times a day (QID) | RECTAL | Status: DC | PRN

## 2015-12-17 MED ORDER — ASPIRIN 81 MG PO TABS: 81.0000 mg | ORAL_TABLET | Freq: Every day | ORAL | Status: DC

## 2015-12-17 MED ORDER — FLEET ENEMA 7-19 GM/118ML RE ENEM: 1.0000 | ENEMA | Freq: Once | RECTAL | Status: DC | PRN

## 2015-12-17 MED ORDER — VITAMIN B-6 100 MG PO TABS: 100.0000 mg | ORAL_TABLET | Freq: Every day | ORAL | Status: DC

## 2015-12-17 MED ORDER — VITAMIN C 500 MG PO TABS: 1000.0000 mg | ORAL_TABLET | Freq: Every day | ORAL | Status: DC

## 2015-12-17 MED ORDER — CALCIUM CARBONATE-VITAMIN D 500-200 MG-UNIT PO TABS
1.0000 | ORAL_TABLET | Freq: Every day | ORAL | Status: DC
  Administered 2015-12-18: 1 via ORAL
  Filled 2015-12-17: qty 1

## 2015-12-17 MED ORDER — BUPIVACAINE-EPINEPHRINE (PF) 0.5% -1:200000 IJ SOLN
INTRAMUSCULAR | Status: AC
  Filled 2015-12-17: qty 30

## 2015-12-17 MED ORDER — ACETAMINOPHEN 325 MG PO TABS: 650.0000 mg | ORAL_TABLET | Freq: Four times a day (QID) | ORAL | Status: DC | PRN

## 2015-12-17 MED ORDER — CEFAZOLIN SODIUM-DEXTROSE 2-4 GM/100ML-% IV SOLN
2.0000 g | Freq: Once | INTRAVENOUS | Status: AC
  Administered 2015-12-17: 2 g via INTRAVENOUS

## 2015-12-17 MED ORDER — MULTI-VITAMINS PO TABS: 1.0000 | ORAL_TABLET | Freq: Every day | ORAL | Status: DC

## 2015-12-17 MED ORDER — HYDROMORPHONE HCL 1 MG/ML IJ SOLN
0.2500 mg | INTRAMUSCULAR | Status: DC | PRN
Start: 2015-12-17 — End: 2015-12-17

## 2015-12-17 MED ORDER — LACTATED RINGERS IV SOLN
INTRAVENOUS | Status: DC
  Administered 2015-12-17: 07:00:00 via INTRAVENOUS

## 2015-12-17 MED ORDER — ENOXAPARIN SODIUM 40 MG/0.4ML ~~LOC~~ SOLN
40.0000 mg | SUBCUTANEOUS | Status: DC
  Administered 2015-12-18: 40 mg via SUBCUTANEOUS
  Filled 2015-12-17: qty 0.4

## 2015-12-17 MED ORDER — ADULT MULTIVITAMIN W/MINERALS CH
1.0000 | ORAL_TABLET | Freq: Every day | ORAL | Status: DC
  Administered 2015-12-18: 1 via ORAL
  Filled 2015-12-17: qty 1

## 2015-12-17 MED ORDER — PROPOFOL 500 MG/50ML IV EMUL
INTRAVENOUS | Status: DC | PRN
  Administered 2015-12-17: 30 ug/kg/min via INTRAVENOUS

## 2015-12-17 MED ORDER — EPINEPHRINE 0.3 MG/0.3ML IJ SOAJ: 0.3000 mg | Freq: Once | INTRAMUSCULAR | Status: DC

## 2015-12-17 MED ORDER — VITAMIN C 500 MG PO TABS
1000.0000 mg | ORAL_TABLET | Freq: Every day | ORAL | Status: DC
  Administered 2015-12-18: 1000 mg via ORAL
  Filled 2015-12-17: qty 2

## 2015-12-17 MED ORDER — METOCLOPRAMIDE HCL 5 MG/ML IJ SOLN: 5.0000 mg | Freq: Three times a day (TID) | INTRAMUSCULAR | Status: DC | PRN

## 2015-12-17 MED ORDER — ACETAMINOPHEN 10 MG/ML IV SOLN
1000.0000 mg | Freq: Four times a day (QID) | INTRAVENOUS | Status: AC
  Administered 2015-12-17 – 2015-12-18 (×4): 1000 mg via INTRAVENOUS
  Filled 2015-12-17 (×3): qty 100

## 2015-12-17 MED ORDER — MAGNESIUM OXIDE 400 (241.3 MG) MG PO TABS
200.0000 mg | ORAL_TABLET | Freq: Every day | ORAL | Status: DC
  Administered 2015-12-18: 200 mg via ORAL
  Filled 2015-12-17: qty 1

## 2015-12-17 MED ORDER — TRANEXAMIC ACID 1000 MG/10ML IV SOLN
INTRAVENOUS | Status: AC
  Filled 2015-12-17: qty 10

## 2015-12-17 MED ORDER — PROPOFOL 10 MG/ML IV BOLUS
INTRAVENOUS | Status: DC | PRN
  Administered 2015-12-17: 22 mg via INTRAVENOUS

## 2015-12-17 MED ORDER — VITAMIN B-12 1000 MCG PO TABS
1000.0000 ug | ORAL_TABLET | Freq: Every day | ORAL | Status: DC
  Administered 2015-12-18: 1000 ug via ORAL
  Filled 2015-12-17: qty 1

## 2015-12-17 MED ORDER — DOCUSATE SODIUM 100 MG PO CAPS
100.0000 mg | ORAL_CAPSULE | Freq: Two times a day (BID) | ORAL | Status: DC
  Administered 2015-12-17 – 2015-12-18 (×3): 100 mg via ORAL
  Filled 2015-12-17 (×3): qty 1

## 2015-12-17 MED ORDER — NEOMYCIN-POLYMYXIN B GU 40-200000 IR SOLN
Status: DC | PRN
  Administered 2015-12-17: 16 mL

## 2015-12-17 MED ORDER — MIDAZOLAM HCL 5 MG/5ML IJ SOLN
INTRAMUSCULAR | Status: DC | PRN
  Administered 2015-12-17: 2 mg via INTRAVENOUS

## 2015-12-17 MED ORDER — POTASSIUM GLUCONATE 595 MG PO CAPS: 1.0000 | ORAL_CAPSULE | Freq: Every day | ORAL | Status: DC

## 2015-12-17 MED ORDER — BUPIVACAINE LIPOSOME 1.3 % IJ SUSP
INTRAMUSCULAR | Status: AC
  Filled 2015-12-17: qty 20

## 2015-12-17 MED ORDER — BISACODYL 10 MG RE SUPP: 10.0000 mg | Freq: Every day | RECTAL | Status: DC | PRN

## 2015-12-17 MED ORDER — ONDANSETRON HCL 4 MG PO TABS: 4.0000 mg | ORAL_TABLET | Freq: Four times a day (QID) | ORAL | Status: DC | PRN

## 2015-12-17 MED ORDER — ACETAMINOPHEN 10 MG/ML IV SOLN
INTRAVENOUS | Status: AC
  Administered 2015-12-17: 1000 mg via INTRAVENOUS
  Filled 2015-12-17: qty 100

## 2015-12-17 MED ORDER — OXYCODONE HCL 5 MG PO TABS
5.0000 mg | ORAL_TABLET | ORAL | Status: DC | PRN
  Administered 2015-12-17 (×2): 5 mg via ORAL
  Administered 2015-12-17: 10 mg via ORAL
  Administered 2015-12-18 (×2): 5 mg via ORAL
  Filled 2015-12-17: qty 1
  Filled 2015-12-17: qty 42
  Filled 2015-12-17: qty 1
  Filled 2015-12-17 (×3): qty 2
  Filled 2015-12-17: qty 1

## 2015-12-17 MED ORDER — KETAMINE HCL 50 MG/ML IJ SOLN
INTRAMUSCULAR | Status: DC | PRN
Start: 2015-12-17 — End: 2015-12-17
  Administered 2015-12-17: 2.2 mg via INTRAMUSCULAR

## 2015-12-17 MED ORDER — MAGNESIUM HYDROXIDE 400 MG/5ML PO SUSP: 30.0000 mL | Freq: Every day | ORAL | Status: DC | PRN

## 2015-12-17 MED ORDER — VITAMIN B-6 50 MG PO TABS
100.0000 mg | ORAL_TABLET | Freq: Every day | ORAL | Status: DC
  Administered 2015-12-18: 100 mg via ORAL
  Filled 2015-12-17: qty 1
  Filled 2015-12-17 (×2): qty 2

## 2015-12-17 MED ORDER — HYDROMORPHONE HCL 1 MG/ML IJ SOLN
1.0000 mg | INTRAMUSCULAR | Status: DC | PRN
  Administered 2015-12-17 – 2015-12-18 (×2): 1 mg via INTRAVENOUS
  Filled 2015-12-17 (×2): qty 1

## 2015-12-17 MED ORDER — BUPIVACAINE-EPINEPHRINE (PF) 0.5% -1:200000 IJ SOLN
INTRAMUSCULAR | Status: DC | PRN
  Administered 2015-12-17: 30 mL via PERINEURAL

## 2015-12-17 MED ORDER — CEFAZOLIN SODIUM-DEXTROSE 2-4 GM/100ML-% IV SOLN
2.0000 g | Freq: Four times a day (QID) | INTRAVENOUS | Status: AC
  Administered 2015-12-17 – 2015-12-18 (×3): 2 g via INTRAVENOUS
  Filled 2015-12-17 (×3): qty 100

## 2015-12-17 MED ORDER — DIPHENHYDRAMINE HCL 12.5 MG/5ML PO ELIX: 12.5000 mg | ORAL_SOLUTION | ORAL | Status: DC | PRN

## 2015-12-17 MED ORDER — SODIUM CHLORIDE 0.9 % IV SOLN
250.0000 mg | INTRAVENOUS | Status: DC | PRN
  Administered 2015-12-17: 3 ug/kg/min via INTRAVENOUS

## 2015-12-17 MED ORDER — PROMETHAZINE HCL 25 MG/ML IJ SOLN: 6.2500 mg | INTRAMUSCULAR | Status: DC | PRN

## 2015-12-17 MED ORDER — NEOMYCIN-POLYMYXIN B GU 40-200000 IR SOLN
Status: AC
  Filled 2015-12-17: qty 20

## 2015-12-17 MED ORDER — BUPIVACAINE HCL (PF) 0.5 % IJ SOLN
INTRAMUSCULAR | Status: DC | PRN
  Administered 2015-12-17: 3 mL

## 2015-12-17 MED ORDER — METOCLOPRAMIDE HCL 10 MG PO TABS
5.0000 mg | ORAL_TABLET | Freq: Three times a day (TID) | ORAL | Status: DC | PRN
Start: 2015-12-17 — End: 2015-12-18

## 2015-12-17 MED ORDER — CEFAZOLIN SODIUM-DEXTROSE 2-4 GM/100ML-% IV SOLN
INTRAVENOUS | Status: AC
  Filled 2015-12-17: qty 100

## 2015-12-17 MED ORDER — EPINEPHRINE 0.3 MG/0.3ML IJ SOAJ
0.3000 mg | Freq: Once | INTRAMUSCULAR | Status: DC | PRN
  Filled 2015-12-17: qty 0.3

## 2015-12-17 MED ORDER — FENTANYL CITRATE (PF) 100 MCG/2ML IJ SOLN
INTRAMUSCULAR | Status: DC | PRN
  Administered 2015-12-17 (×2): 50 ug via INTRAVENOUS

## 2015-12-17 MED ORDER — SODIUM CHLORIDE 0.9 % IV SOLN
INTRAVENOUS | Status: DC | PRN
  Administered 2015-12-17: 60 mL

## 2015-12-17 SURGICAL SUPPLY — 77 items
BANDAGE ACE 6X5 VEL STRL LF (GAUZE/BANDAGES/DRESSINGS) ×3 IMPLANT
BLADE SURG SZ10 CARB STEEL (BLADE) ×12 IMPLANT
BNDG COHESIVE 4X5 TAN STRL (GAUZE/BANDAGES/DRESSINGS) ×3 IMPLANT
BNDG COHESIVE 6X5 TAN STRL LF (GAUZE/BANDAGES/DRESSINGS) ×3 IMPLANT
BNDG ESMARK 6X12 TAN STRL LF (GAUZE/BANDAGES/DRESSINGS) ×3 IMPLANT
BONE CEMENT PALACOSE (Orthopedic Implant) ×3 IMPLANT
CANISTER SUCT 1200ML W/VALVE (MISCELLANEOUS) ×3 IMPLANT
CANISTER SUCT 3000ML (MISCELLANEOUS) ×3 IMPLANT
CAPT KNEE PARTIAL 2 ×3 IMPLANT
CATH FOL LEG HOLDER (MISCELLANEOUS) ×3 IMPLANT
CATH TRAY METER 16FR LF (MISCELLANEOUS) ×3 IMPLANT
CEMENT BONE PALACOSE (Orthopedic Implant) ×1 IMPLANT
CHLORAPREP W/TINT 26ML (MISCELLANEOUS) ×6 IMPLANT
CNTNR SPEC C3OZ STD GRAD LEK (MISCELLANEOUS) IMPLANT
CONT SPEC 3OZ W/LID STRL (MISCELLANEOUS)
COOLER POLAR GLACIER W/PUMP (MISCELLANEOUS) ×3 IMPLANT
COVER MAYO STAND STRL (DRAPES) ×3 IMPLANT
CUFF TOURN 24 STER (MISCELLANEOUS) IMPLANT
CUFF TOURN 30 STER DUAL PORT (MISCELLANEOUS) ×3 IMPLANT
DECANTER SPIKE VIAL GLASS SM (MISCELLANEOUS) ×9 IMPLANT
DRAPE C-ARM XRAY 36X54 (DRAPES) IMPLANT
DRAPE INCISE IOBAN 66X45 STRL (DRAPES) ×3 IMPLANT
DRAPE POUCH INSTRU U-SHP 10X18 (DRAPES) ×3 IMPLANT
DRAPE TABLE BACK 80X90 (DRAPES) ×3 IMPLANT
DRSG OPSITE POSTOP 4X12 (GAUZE/BANDAGES/DRESSINGS) ×3 IMPLANT
DRSG OPSITE POSTOP 4X14 (GAUZE/BANDAGES/DRESSINGS) IMPLANT
DRSG OPSITE POSTOP 4X6 (GAUZE/BANDAGES/DRESSINGS) ×3 IMPLANT
ELECT CAUTERY BLADE 6.4 (BLADE) ×3 IMPLANT
ELECT REM PT RETURN 9FT ADLT (ELECTROSURGICAL) ×3
ELECTRODE REM PT RTRN 9FT ADLT (ELECTROSURGICAL) ×1 IMPLANT
GAUZE PETRO XEROFOAM 1X8 (MISCELLANEOUS) IMPLANT
GAUZE SPONGE 4X4 12PLY STRL (GAUZE/BANDAGES/DRESSINGS) IMPLANT
GLOVE BIO SURGEON STRL SZ7.5 (GLOVE) ×6 IMPLANT
GLOVE BIO SURGEON STRL SZ8 (GLOVE) ×6 IMPLANT
GLOVE BIOGEL PI IND STRL 8 (GLOVE) ×1 IMPLANT
GLOVE BIOGEL PI INDICATOR 8 (GLOVE) ×2
GLOVE INDICATOR 8.0 STRL GRN (GLOVE) ×3 IMPLANT
GOWN STRL REUS W/ TWL LRG LVL3 (GOWN DISPOSABLE) ×2 IMPLANT
GOWN STRL REUS W/ TWL XL LVL3 (GOWN DISPOSABLE) ×1 IMPLANT
GOWN STRL REUS W/TWL LRG LVL3 (GOWN DISPOSABLE) ×4
GOWN STRL REUS W/TWL XL LVL3 (GOWN DISPOSABLE) ×2
GRADUATE 1200CC STRL 31836 (MISCELLANEOUS) ×3 IMPLANT
HANDLE YANKAUER SUCT BULB TIP (MISCELLANEOUS) ×3 IMPLANT
HANDPIECE INTERPULSE COAX TIP (DISPOSABLE) ×2
HOOD PEEL AWAY FLYTE STAYCOOL (MISCELLANEOUS) ×12 IMPLANT
KIT RM TURNOVER STRD PROC AR (KITS) ×3 IMPLANT
MAT BLUE FLOOR 46X72 FLO (MISCELLANEOUS) IMPLANT
NDL SAFETY 18GX1.5 (NEEDLE) ×3 IMPLANT
NEEDLE SPNL 18GX3.5 QUINCKE PK (NEEDLE) ×3 IMPLANT
NEEDLE SPNL 20GX3.5 QUINCKE YW (NEEDLE) ×3 IMPLANT
NS IRRIG 1000ML POUR BTL (IV SOLUTION) ×3 IMPLANT
PACK ARTHROSCOPY KNEE (MISCELLANEOUS) ×3 IMPLANT
PACK BLADE SAW RECIP 70 3 PT (BLADE) ×3 IMPLANT
PAD WRAPON POLAR KNEE (MISCELLANEOUS) ×1 IMPLANT
PADDING CAST 4IN STRL (MISCELLANEOUS)
PADDING CAST BLEND 4X4 STRL (MISCELLANEOUS) IMPLANT
PENCIL ELECTRO HAND CTR (MISCELLANEOUS) ×3 IMPLANT
SET HNDPC FAN SPRY TIP SCT (DISPOSABLE) ×1 IMPLANT
SOL .9 NS 3000ML IRR  AL (IV SOLUTION) ×2
SOL .9 NS 3000ML IRR UROMATIC (IV SOLUTION) ×1 IMPLANT
SPONGE LAP 18X18 5 PK (GAUZE/BANDAGES/DRESSINGS) IMPLANT
SPONGE XRAY 4X4 16PLY STRL (MISCELLANEOUS) ×3 IMPLANT
STAPLER SKIN PROX 35W (STAPLE) ×3 IMPLANT
STRAP SAFETY BODY (MISCELLANEOUS) ×3 IMPLANT
SUCTION FRAZIER HANDLE 10FR (MISCELLANEOUS) ×2
SUCTION TUBE FRAZIER 10FR DISP (MISCELLANEOUS) ×1 IMPLANT
SUT VIC AB 2-0 CT1 27 (SUTURE) ×8
SUT VIC AB 2-0 CT1 TAPERPNT 27 (SUTURE) ×4 IMPLANT
SYR 20CC LL (SYRINGE) ×3 IMPLANT
SYR 30ML LL (SYRINGE) ×6 IMPLANT
SYR BULB IRRIG 60ML STRL (SYRINGE) ×3 IMPLANT
SYRINGE 10CC LL (SYRINGE) ×3 IMPLANT
TAPE TRANSPORE STRL 2 31045 (GAUZE/BANDAGES/DRESSINGS) IMPLANT
TUBING CONNECTING 10 (TUBING) ×2 IMPLANT
TUBING CONNECTING 10' (TUBING) ×1
WRAPON POLAR PAD KNEE (MISCELLANEOUS) ×3
ZIMMER COMPACT CEMENT MIXER ×3 IMPLANT

## 2015-12-17 NOTE — Anesthesia Preprocedure Evaluation (Addendum)
Anesthesia Evaluation  Patient identified by MRN, date of birth, ID band Patient awake    Reviewed: Allergy & Precautions, H&P , NPO status , Patient's Chart, lab work & pertinent test results, reviewed documented beta blocker date and time   History of Anesthesia Complications Negative for: history of anesthetic complications  Airway Mallampati: III  TM Distance: >3 FB Neck ROM: full    Dental no notable dental hx. (+) Teeth Intact   Pulmonary neg shortness of breath, sleep apnea , neg COPD, neg recent URI,    Pulmonary exam normal breath sounds clear to auscultation       Cardiovascular Exercise Tolerance: Good negative cardio ROS Normal cardiovascular exam Rhythm:regular Rate:Normal     Neuro/Psych negative neurological ROS  negative psych ROS   GI/Hepatic Neg liver ROS, hiatal hernia, GERD  ,  Endo/Other  negative endocrine ROS  Renal/GU negative Renal ROS  negative genitourinary   Musculoskeletal   Abdominal   Peds  Hematology negative hematology ROS (+)   Anesthesia Other Findings Past Medical History:   GERD (gastroesophageal reflux disease)                       DJD (degenerative joint disease)                             Leg cramps                                                   Cataract                                                     DDD (degenerative disc disease), lumbar                      Heartburn                                                    Arthritis                                                    BPH with obstruction/lower urinary tract sympt*              Hypogonadism in male                                         Sleep apnea                                                    Comment:patient denies   HH (hiatus hernia)  Cancer ([place])                                                 Prostate cancer ([place])                                         Reproductive/Obstetrics negative OB ROS                             Anesthesia Physical Anesthesia Plan  ASA: II  Anesthesia Plan: Spinal   Post-op Pain Management:    Induction:   Airway Management Planned:   Additional Equipment:   Intra-op Plan:   Post-operative Plan:   Informed Consent: I have reviewed the patients History and Physical, chart, labs and discussed the procedure including the risks, benefits and alternatives for the proposed anesthesia with the patient or authorized representative who has indicated his/her understanding and acceptance.   Dental Advisory Given  Plan Discussed with: Anesthesiologist, CRNA and Surgeon  Anesthesia Plan Comments:         Anesthesia Quick Evaluation

## 2015-12-17 NOTE — Op Note (Signed)
12/17/2015  10:09 AM  Patient:   Alexander Woods  Pre-Op Diagnosis:   Degenerative joint disease of medial compartment, right knee.  Post-Op Diagnosis:   Same.  Procedure:   Right unicondylar knee arthroplasty.  Surgeon:   Pascal Lux, MD  Assistant:   Cameron Proud, PA-C  Anesthesia:   Spinal  Findings:   As above.  Complications:   None  EBL:   10 cc  Fluids:   1000 cc crystalloid  UOP:   400 cc  TT:   80 minutes at 300 mmHg  Drains:   None  Closure:   Staples  Implants:   All-cemented Biomet Oxford system with a small femoral component, a "D" sized tibial tray, and a 6 mm meniscal bearing insert.  Brief Clinical Note:   The patient is a 70 year old male with a history of progressively worsening medial sided right knee pain. His symptoms have progressed despite medications, activity modification, etc. His history and examination consistent with a degenerative medial meniscus tear. An MRI scan confirmed the presence of the meniscus tear with also demonstrated advanced degenerative changes involving the weightbearing portion the medial femoral condyle and the adjacent medial tibial plateau. The patient presents at this time for a right partial knee replacement.  Procedure:   The patient was brought into the operating room and a spinal placed by the anesthesiologist. The patient was lain in the supine position and a Foley catheter inserted. The patient was repositioned so that the non-surgical leg was placed in a flexed and abducted position in the yellow fin leg holder while the surgical extremity was placed over the Biomet leg holder. The right lower extremity was prepped with ChloraPrep solution before being draped sterilely. Preoperative antibiotics were administered. After performing a timeout to verify the appropriate surgical site, the limb was exsanguinated with an Esmarch and the tourniquet inflated to 300 mmHg. A standard anterior approach to the knee was made  through an approximately 3.5-4 inch incision. The incision was carried down through the subcutaneous tissues to expose the superficial retinaculum. This was split the length the incision and medial flap elevated sufficiently to expose the medial retinaculum. This was incised along the medial border of the patella tendon and extended proximally along the medial border of the patella, leaving a 3-4 mm cuff of tissue. The soft tissues were elevated off the anteromedial aspect of the proximal tibia. The anterior portion of the meniscus was removed after performing a subtotal excision of the infrapatellar fat pad. The anterior cruciate ligament was inspected and found to be in excellent condition. Osteophytes were removed from the inferior pole of the patella as well as from the notch using a quarter-inch osteotome. There were significant degenerative changes of both the femur and tibia on the medial side. The medial femoral condyle was sized using the small and medium sizers. It was felt that the small guide best optimized the contour of the femur. This was left in place and the external tibial guide positioned. The coupling device was used to connect the guide to the medial femoral condylar sizer to optimize appropriate orientation. Two guide pins were inserted into the cutting block before the coupling device and sizer were removed. The appropriate tibial cut was made using the oscillating and reciprocating saws. The piece was removed in its entirety and taken to the back table where it was sized and found to be optimally replicated by a "D" sized component. The 9 mm spacer was inserted to verify that sufficient  bone had been removed.  Attention was directed to femoral side. The intramedullary canal was accessed through a 4 mm drill hole. The intramedullary guide was positioned before the guide for the femoral condylar holes was positioned. The appropriate coupling device connected this guide to the intramedullary  guide before both drill holes were created in the distal aspect of the medial femoral condyle. The devices were removed and the posterior condylar cutting block inserted. The appropriate cut was made using the reciprocating saw and this piece removed. The #0 spigot was inserted and the initial bone milling performed. A trial femoral component was inserted and both the flexion and extension gaps measured. In flexion, the gap measured 6 mm whereas in extension, it measured 3 mm. Therefore, the #3 spigot was selected and the secondary bone milling performed. Repeat sizing demonstrated symmetric flexion and extension gaps. The bone was removed from the postero-medial and postero-lateral aspects of the femoral condyle, as well as from the beneath the collar of the spigot. Bone also was removed from the anterior portion of the femur so as to minimize any potential impingement with the meniscal bearing insert. The trial components removed and several drill holes placed into the distal femoral condyle to further augment cement fixation.  Attention was redirected to the tibial side. The "D" sized tibial tray was positioned and temporarily secured using the appropriate spiked nail. The keel was created using the bi-bladed reciprocating saw and hoe. The keeled "D" sized trial tibial tray was inserted to be sure that it seated properly. At this point, a total of 20 cc of Exparel diluted out to 60 cc with normal saline and 30 cc of 0.5% Sensorcaine was injected in and around the posterior and medial capsular tissues, as well as the peri-incisional tissues to help with postoperative pain control.  The bony surfaces were prepared for cementing by irrigating them thoroughly with bacitracin saline solution using the jet lavage system before packing them with a dry Ray-Tec sponge. Meanwhile, cement was being mixed on the back table. When the cement was ready, the tibial tray was cemented in first. The excess cement was removed  using a Surveyor, quantity after impacting it into place. Next, the femoral component was impacted into place. Again the excess cement was removed using a Surveyor, quantity. The 6 mm spacer was inserted and the knee brought into near full extension while the cement hardened. Once the cement hardened, the spacer was removed and the 5 mm and 6 mm meniscal bearing inserts were trialed. The 6 mm meniscal bearing insert demonstrated excellent tracking while the knee was placed through a range of motion, and showed no evidence towards subluxation or dislocation. In addition, it did not fit too tightly. Therefore, the permanent 6 mm meniscal bearing insert was snapped into position after verifying that no cement had been retained posteriorly. Again the knee was placed through a range of motion with the findings as described above.  The wound was copiously irrigated with bacitracin saline solution via the jet lavage system before the retinacular layer was reapproximated using #0 Vicryl interrupted sutures. At this point, 1 g of transexemic acid in 10 cc of normal saline was injected intra-articularly. The subcutaneous tissues were closed in two layers using 2-0 Vicryl interrupted sutures before the skin was closed using staples. A sterile occlusive dressing was applied to the knee before the patient was awakened. The patient was transferred back to his hospital bed and returned to the recovery room in satisfactory condition after  tolerating the procedure well. A Polar Care device was applied to the knee as well.

## 2015-12-17 NOTE — Evaluation (Signed)
Physical Therapy Evaluation Patient Details Name: Alexander Woods MRN: [id number] DOB: [birth year] Today's Date: 12/17/2015   History of Present Illness  Pt underwent partial knee replacement due to chronic knee pain associated with a medial maniscus tear.   Clinical Impression  Pt is a pleasant 70 y/o male admitted for partial knee replacement subsequent to chronic a meniscus tear. He presents with 5/10 Px, but is able to do 10+ SLRs with operated LE, therefore pt did not require knee immobilizer. Pt requires only supervision to ensure safety for bed mobility, and transfers from sit-to-stand without weight bearing on his R LE with CGA. He is able to ambulate with step-to gait pattern, but required CGA and cuing for safe and efficient use of RW. Pt acheived 1-106 degrees of AROM in R knee. Pt is appropriate for skilled therapy at this time. He will benefit most from home health PT following discharge to address deficits in ROM, balance, gait, strength, and endurence.     Follow Up Recommendations Home health PT    Equipment Recommendations  Rolling walker with 5" wheels    Recommendations for Other Services       Precautions / Restrictions Precautions Precautions: Fall Restrictions Weight Bearing Restrictions: Yes RLE Weight Bearing: Weight bearing as tolerated      Mobility  Bed Mobility Overal bed mobility: Independent;Needs Assistance Bed Mobility: Supine to Sit     Supine to sit: Supervision     General bed mobility comments: minimal effeort, required only cues for safety  Transfers Overall transfer level: Needs assistance Equipment used: Rolling walker (2 wheeled) Transfers: Sit to/from Stand Sit to Stand: Min assist         General transfer comment: Transfers with no weight through R LE. Able to wieght bear in standing  Ambulation/Gait Ambulation/Gait assistance: Min assist Ambulation Distance (Feet): 40 Feet Assistive device: Rolling walker (2  wheeled) Gait Pattern/deviations: Step-to pattern     General Gait Details: Pt required cuing for safe and efficient use of RW. Gait was step-to pattern with moderate weight through UEs.  Stairs            Wheelchair Mobility    Modified Rankin (Stroke Patients Only)       Balance Overall balance assessment: Modified Independent                                           Pertinent Vitals/Pain Pain Assessment: 0-10 Pain Score: 5  Pain Location: incision site Pain Descriptors / Indicators: Operative site guarding Pain Intervention(s): Limited activity within patient's tolerance;Monitored during session;Premedicated before session;Ice applied    Home Living Family/patient expects to be discharged to:: Private residence Living Arrangements: Spouse/significant other Available Help at Discharge: Family Type of Home: House Home Access: Stairs to enter Entrance Stairs-Rails: None Entrance Stairs-Number of Steps: 2 Home Layout: Able to live on main level with bedroom/bathroom Home Equipment: None      Prior Function Level of Independence: Independent               Hand Dominance        Extremity/Trunk Assessment   Upper Extremity Assessment: Overall WFL for tasks assessed           Lower Extremity Assessment: Generalized weakness (L LE grossly 5/5, R LE able to do 10+ SLRs with min effort)      Cervical / Trunk Assessment: Normal  Communication   Communication: No difficulties  Cognition Arousal/Alertness: Awake/alert Behavior During Therapy: WFL for tasks assessed/performed Overall Cognitive Status: Within Functional Limits for tasks assessed                      General Comments      Exercises Total Joint Exercises Goniometric ROM: R LE AROM 1-106 degrees (Ext measured in supine, flexion measured in sitting) Other Exercises Other Exercises: R LE: SLRs X 10 reps in suping, hip abd/add X 10 in supine, quad sets X 10  reps, heel slides X 3 reps, all with min assist      Assessment/Plan    PT Assessment Patient needs continued PT services  PT Diagnosis Difficulty walking;Abnormality of gait   PT Problem List Decreased strength;Decreased range of motion;Decreased activity tolerance;Decreased balance;Decreased mobility;Decreased coordination;Decreased knowledge of use of DME;Decreased safety awareness  PT Treatment Interventions DME instruction;Gait training;Stair training;Functional mobility training;Therapeutic activities;Therapeutic exercise;Neuromuscular re-education   PT Goals (Current goals can be found in the Care Plan section) Acute Rehab PT Goals Patient Stated Goal: go home PT Goal Formulation: With patient Time For Goal Achievement: 12/31/15 Potential to Achieve Goals: Good    Frequency BID   Barriers to discharge        Co-evaluation               End of Session Equipment Utilized During Treatment: Gait belt Activity Tolerance: Patient tolerated treatment well Patient left: in chair;with call bell/phone within reach;with chair alarm set;with family/visitor present (SCDs left off due to pt feeling unsafe ambulating in socks) Nurse Communication: Mobility status         Time: [id number] PT Time Calculation (min) (ACUTE ONLY): 36 min   Charges:   PT Evaluation $PT Eval Moderate Complexity: 1 Procedure PT Treatments $Therapeutic Exercise: 8-22 mins   PT G Codes:        Jupiter Kabir 12-24-15, 5:36 PM

## 2015-12-17 NOTE — Progress Notes (Signed)
PHARMACIST - PHYSICIAN ORDER COMMUNICATION  CONCERNING: P&T Medication Policy on Herbal Medications  DESCRIPTION:  This patient's order for:  Potassium gluconate 595 mg  has been noted.  This product(s) is classified as an "herbal" or natural product. Due to a lack of definitive safety studies or FDA approval, nonstandard manufacturing practices, plus the potential risk of unknown drug-drug interactions while on inpatient medications, the Pharmacy and Therapeutics Committee does not permit the use of "herbal" or natural products of this type within [facility].   ACTION TAKEN: The pharmacy department is unable to verify this order at this time and your patient has been informed of this safety policy. Please reevaluate patient's clinical condition at discharge and address if the herbal or natural product(s) should be resumed at that time.  Darrow Bussing, PharmD

## 2015-12-17 NOTE — Anesthesia Procedure Notes (Signed)
Spinal Patient location during procedure: OR Start time: 12/17/2015 7:39 AM End time: 12/17/2015 7:44 AM Staffing Anesthesiologist: Martha Clan Performed by: anesthesiologist  Preanesthetic Checklist Completed: patient identified, site marked, surgical consent, pre-op evaluation, timeout performed, IV checked, risks and benefits discussed and monitors and equipment checked Spinal Block Patient position: sitting Prep: ChloraPrep Patient monitoring: heart rate, continuous pulse ox, blood pressure and cardiac monitor Approach: midline Location: L4-5 Injection technique: single-shot Needle Needle type: Whitacre and Introducer  Needle gauge: 24 G Needle length: 9 cm Additional Notes Negative paresthesia. Negative blood return. Positive free-flowing CSF. Expiration date of kit checked and confirmed. Patient tolerated procedure well, without complications.

## 2015-12-17 NOTE — Transfer of Care (Signed)
Immediate Anesthesia Transfer of Care Note  Patient: Alexander Woods  Procedure(s) Performed: Procedure(s): UNICOMPARTMENTAL KNEE (Right)  Patient Location: PACU  Anesthesia Type:Spinal  Level of Consciousness: awake, alert , oriented and patient cooperative  Airway & Oxygen Therapy: Patient Spontanous Breathing and Patient connected to face mask oxygen  Post-op Assessment: Report given to RN and Post -op Vital signs reviewed and stable  Post vital signs: Reviewed and stable  Last Vitals:  Filed Vitals:   12/17/15 0610  BP: 165/104  Pulse: 75  Temp: 36.7 C  Resp: 18    Last Pain:  Filed Vitals:   12/17/15 0613  PainSc: 5          Complications: No apparent anesthesia complications

## 2015-12-17 NOTE — Anesthesia Postprocedure Evaluation (Signed)
Anesthesia Post Note  Patient: Alexander Woods  Procedure(s) Performed: Procedure(s) (LRB): UNICOMPARTMENTAL KNEE (Right)  Patient location during evaluation: PACU Anesthesia Type: General Level of consciousness: awake and alert Pain management: pain level controlled Vital Signs Assessment: post-procedure vital signs reviewed and stable Respiratory status: spontaneous breathing, nonlabored ventilation, respiratory function stable and patient connected to nasal cannula oxygen Cardiovascular status: blood pressure returned to baseline and stable Postop Assessment: no signs of nausea or vomiting Anesthetic complications: no    Last Vitals:  Filed Vitals:   12/17/15 1339 12/17/15 1430  BP: 152/87 146/94  Pulse: 63 64  Temp: 36.6 C   Resp: 18 18    Last Pain:  Filed Vitals:   12/17/15 1432  PainSc: 3                  Martha Clan

## 2015-12-17 NOTE — H&P (Signed)
Paper H&P to be scanned into permanent record. H&P reviewed. No changes. 

## 2015-12-17 NOTE — Care Management Note (Addendum)
Case Management Note  Patient Details  Name: Alexander Woods MRN: [id number] Date of Birth: 06-Oct-1945  Subjective/Objective:    Spoke with patient and spouse at the bedside. He is alert and oriented from home and independent.   Spouse is able to drive and assist patient with needs.  Discussed Lovenox and home health needs. Patient offered choice of providers and agreed upon [facility] if needed. PT will need to evaluate. Pharmacy is [facility]. Patient will need front rolling walker but stated that he did not feel he needed a BSC since his bathroom was located near his bed. PCP is J. Singh      Lovenox 40mg  injection daily for 14 days called to pharmacy for a co pay of $51 dollars. Informed patient.  Walker ordered from [place].     Action/Plan: Home with [place] [facility].   Expected Discharge Date:                  Expected Discharge Plan:  [place]  In-House Referral:     Discharge planning Services  CM Consult  Post Acute Care Choice:  Home Health, Durable Medical Equipment Choice offered to:  Patient  DME Arranged:  Walker rolling DME Agency:  [place]:  PT [place]:  [employer] (now [facility])  Status of Service:  In process, will continue to follow  If discussed at Long Length of Stay Meetings, dates discussed:    Additional Comments:  Alvie Heidelberg, RN 12/17/2015, 1:40 PM

## 2015-12-18 LAB — CBC WITH DIFFERENTIAL/PLATELET
BASOS ABS: 0 10*3/uL (ref 0–0.1)
BASOS PCT: 0 %
EOS ABS: 0 10*3/uL (ref 0–0.7)
Eosinophils Relative: 0 %
HEMATOCRIT: 40.5 % (ref 40.0–52.0)
HEMOGLOBIN: 13.8 g/dL (ref 13.0–18.0)
Lymphocytes Relative: 12 %
Lymphs Abs: 1.1 10*3/uL (ref 1.0–3.6)
MCH: 28.8 pg (ref 26.0–34.0)
MCHC: 34.1 g/dL (ref 32.0–36.0)
MCV: 84.5 fL (ref 80.0–100.0)
MONO ABS: 1 10*3/uL (ref 0.2–1.0)
MONOS PCT: 10 %
NEUTROS ABS: 7.1 10*3/uL — AB (ref 1.4–6.5)
NEUTROS PCT: 78 %
Platelets: 157 10*3/uL (ref 150–440)
RBC: 4.8 MIL/uL (ref 4.40–5.90)
RDW: 14 % (ref 11.5–14.5)
WBC: 9.1 10*3/uL (ref 3.8–10.6)

## 2015-12-18 LAB — BASIC METABOLIC PANEL
ANION GAP: 5 (ref 5–15)
BUN: 12 mg/dL (ref 6–20)
CALCIUM: 8.4 mg/dL — AB (ref 8.9–10.3)
CO2: 26 mmol/L (ref 22–32)
CREATININE: 0.88 mg/dL (ref 0.61–1.24)
Chloride: 102 mmol/L (ref 101–111)
GFR calc non Af Amer: >60 mL/min (ref >60)
Glucose, Bld: 169 mg/dL — ABNORMAL HIGH (ref 65–99)
Potassium: 4 mmol/L (ref 3.5–5.1)
SODIUM: 133 mmol/L — AB (ref 135–145)

## 2015-12-18 MED ORDER — OXYCODONE HCL 5 MG PO TABS: 5.0000 mg | ORAL_TABLET | ORAL | Status: DC | PRN

## 2015-12-18 NOTE — Progress Notes (Signed)
Physical Therapy Treatment Patient Details Name: Alexander Woods MRN: [id number] DOB: 1945/11/03 Today's Date: 12/18/2015    History of Present Illness Pt underwent partial knee replacement due to chronic knee pain associated with a medial maniscus tear.     PT Comments    Pt was sore and stiff at beginning of treatment, as expected, but was amiable and cooperative. Pt ambulated around the nurse's station and to the ortho gym; pt performed stair climbing safely that was equivalent to home environment. Gait was a combination of step-to and step-through gait pattern with cuing for step length, DME use, and gait sequence. Pt is currently capable of safe reciprocal gait, but was over-thinking it. Pt will benefit from skilled therapy to optimize DME use, gait pattern, and neuromuscular re-education. Pt met goals for ROM (2-90 degrees), ambulation distance (260'), and for stair climbing and he is safe to continue PT with home health therapy.    Follow Up Recommendations  Home health PT     Equipment Recommendations  Rolling walker with 5" wheels    Recommendations for Other Services       Precautions / Restrictions Precautions Precautions: Fall Restrictions Weight Bearing Restrictions: Yes RLE Weight Bearing: Weight bearing as tolerated    Mobility  Bed Mobility Overal bed mobility: Independent Bed Mobility: Supine to Sit     Supine to sit: Supervision     General bed mobility comments: minimal effeort, required only cues for safety  Transfers Overall transfer level: Needs assistance Equipment used: Rolling walker (2 wheeled) Transfers: Sit to/from Stand Sit to Stand: Min assist         General transfer comment: Transfers with no weight through R LE due to stiffness. Able to wieght bear in standing  Ambulation/Gait Ambulation/Gait assistance: Min assist Ambulation Distance (Feet): 260 Feet Assistive device: Rolling walker (2 wheeled) Gait Pattern/deviations: Step-to  pattern;Step-through pattern;Decreased step length - left;Decreased stance time - right;Trunk flexed Gait velocity: Pt ambulated 10' in 16 seconds   General Gait Details: Pt required cuing for safe and efficient use of RW; he tends to stand too far forward or two far behind RW. Gait alternated b/w step-to pattern and step-through with moderate weight through UEs and slightly forward flexed posture. Pt was able to abulate with safe technique with cuing.    Stairs Stairs: Yes Stairs assistance: Min guard Stair Management: No rails;Backwards;With walker Number of Stairs: 3 General stair comments: Safe technique was demostrated to pt before attempt; pt was able to safely climb up and down 3 stairs with CGA climbing backwards with RW in front with step-to pattern.  Wheelchair Mobility    Modified Rankin (Stroke Patients Only)       Balance Overall balance assessment: Modified Independent                                  Cognition Arousal/Alertness: Awake/alert Behavior During Therapy: WFL for tasks assessed/performed Overall Cognitive Status: Within Functional Limits for tasks assessed                      Exercises Total Joint Exercises Goniometric ROM: R LE AROM 2-90 degrees Other Exercises Other Exercises: R LE: quad sets X 15 reps in supine, SLR X 15 in supine, stair step training X 3 with RW, hip abd/add X 15, heel slides X 5; all done with min assist and verbal/tactile cuing     General Comments  Pertinent Vitals/Pain Pain Assessment: Faces Faces Pain Scale: Hurts little more Pain Location: proximal to R lateral femoral condyle Pain Descriptors / Indicators: Pressure Pain Intervention(s): Limited activity within patient's tolerance;Monitored during session;Premedicated before session;Ice applied    Home Living                      Prior Function            PT Goals (current goals can now be found in the care plan section)  Acute Rehab PT Goals Patient Stated Goal: go home PT Goal Formulation: With patient Time For Goal Achievement: 12/31/15 Potential to Achieve Goals: Good Progress towards PT goals: Goals met and updated - see care plan    Frequency  BID    PT Plan Discharge plan needs to be updated (Pt has completed all physical therapy goals)    Co-evaluation             End of Session Equipment Utilized During Treatment: Gait belt Activity Tolerance: Patient tolerated treatment well Patient left: in chair;with call bell/phone within reach;with chair alarm set     Time: 1541-1620 PT Time Calculation (min) (ACUTE ONLY): 39 min  Charges:  $Gait Training: 23-37 mins $Therapeutic Exercise: 8-22 mins                    G Codes:      Traven Davids 01/10/2016, 11:20 AM  Burnett Corrente, SPT [phone]

## 2015-12-18 NOTE — Plan of Care (Signed)
Problem: Acute Rehab PT Goals(only PT should resolve) Goal: Pt Will Ambulate Outcome: Completed/Met Date Met:  12/18/15 Pt ambulated 260' with CGA, RW, and cuing Goal: Pt Will Go Up/Down Stairs Outcome: Completed/Met Date Met:  12/18/15 Pt climbed 3 stairs with RW and min assist that his wife can safely provide Goal: Pt/caregiver will Perform Home Exercise Program Outcome: Completed/Met Date Met:  12/18/15 Pt completed 15 SLRs on R LE and does not need knee immobilizer.

## 2015-12-18 NOTE — Care Management Important Message (Signed)
Important Message  Patient Details  Name: Alexander Woods MRN: [id number] Date of Birth: Feb 21, 1946   Medicare Important Message Given:  N/A - LOS <3 / Initial given by admissions    Alvie Heidelberg, RN 12/18/2015, 11:38 AM

## 2015-12-18 NOTE — Discharge Summary (Signed)
Physician Discharge Summary  Patient ID: Alexander Woods MRN: [id number] DOB/AGE: 72-Apr-1947 72 y.o.  Admit date: 12/17/2015 Discharge date: 12/18/2015  Admission Diagnoses:  primary osteoarthritis,acute medial meniscus tear of right knee initial encounter knee effusion Degenerative joint disease of medial compartment, right knee  Discharge Diagnoses: Patient Active Problem List   Diagnosis Date Noted  . Status post right partial knee replacement 12/17/2015  . History of prostate cancer 10/13/2015  . BPH with obstruction/lower urinary tract symptoms 10/13/2015  . H/O nutritional disorder 03/31/2015  . Degeneration of intervertebral disc of lumbar region 11/14/2014  . Neuritis or radiculitis due to rupture of lumbar intervertebral disc 06/09/2014  . Lumbar canal stenosis 06/09/2014  . LBP (low back pain) 01/24/2014  . Acid reflux 11/08/2013  Degenerative joint disease of medial compartment, right knee  Past Medical History  Diagnosis Date  . GERD (gastroesophageal reflux disease)   . DJD (degenerative joint disease)   . Leg cramps   . Cataract   . DDD (degenerative disc disease), lumbar   . Heartburn   . Arthritis   . BPH with obstruction/lower urinary tract symptoms   . Hypogonadism in male   . Sleep apnea     patient denies  . HH (hiatus hernia)   . Cancer ([place])   . Prostate cancer [facility])     Transfusion: None   Consultants (if any):    Discharged Condition: Improved  Hospital Course: Alexander Woods is an 70 y.o. male who was admitted 12/17/2015 with a diagnosis of degenerative joint disease of the medial compartment of the right knee and went to the operating room on 12/17/2015 and underwent the above named procedures.    Surgeries: Procedure(s): UNICOMPARTMENTAL KNEE on 12/17/2015 Patient tolerated the surgery well. Taken to PACU where she was stabilized and then transferred to the orthopedic floor.  Started on Lovenox 40mg  q 24 hrs. Foot pumps applied  bilaterally at 80 mm. Heels elevated on bed with rolled towels. No evidence of DVT. Negative Homan. Physical therapy started on day #1 for gait training and transfer. OT started day #1 for ADL and assisted devices.  Patient's IV , foley were removed on POD1  Na of 133 on POD1, d/c IVF and encouraged increased PO intake.  Implants: All-cemented [employer] [place] system with a small femoral component, a "D" sized tibial tray, and a 6 mm meniscal bearing insert.  He was given perioperative antibiotics:  Anti-infectives    Start     Dose/Rate Route Frequency Ordered Stop   12/17/15 1130  ceFAZolin (ANCEF) IVPB 2g/100 mL premix     2 g 200 mL/hr over 30 Minutes Intravenous Every 6 hours 12/17/15 1117 12/18/15 0324   12/17/15 0552  ceFAZolin (ANCEF) 2-4 GM/100ML-% IVPB    Comments:  Idamae Lusher: cabinet override      12/17/15 0552 12/17/15 1759   12/17/15 0215  ceFAZolin (ANCEF) IVPB 2g/100 mL premix     2 g 200 mL/hr over 30 Minutes Intravenous  Once 12/17/15 0204 12/17/15 0809    .  He was given sequential compression devices, early ambulation, and Lovenox for DVT prophylaxis.  He benefited maximally from the hospital stay and there were no complications.    Recent vital signs:  Filed Vitals:   12/18/15 0455 12/18/15 0758  BP: 146/80 144/76  Pulse: 67 69  Temp: 97.6 F (36.4 C) 97.5 F (36.4 C)  Resp: 19     Recent laboratory studies:  Lab Results  Component Value Date   HGB 13.8  12/18/2015   Lab Results  Component Value Date   WBC 9.1 12/18/2015   PLT 157 12/18/2015   Lab Results  Component Value Date   INR 1.03 12/09/2015   Lab Results  Component Value Date   NA 133* 12/18/2015   K 4.0 12/18/2015   CL 102 12/18/2015   CO2 26 12/18/2015   BUN 12 12/18/2015   CREATININE 0.88 12/18/2015   GLUCOSE 169* 12/18/2015    Discharge Medications:     Medication List    TAKE these medications        ascorbic acid 1000 MG tablet  Commonly known as:  VITAMIN C   Take 1,000 mg by mouth daily.     aspirin 81 MG tablet  Take 81 mg by mouth daily.     calcium-vitamin D 500-200 MG-UNIT tablet  Commonly known as:  OSCAL WITH D  Take 1 tablet by mouth daily with breakfast. Reported on 10/13/2015     EPINEPHrine 0.3 mg/0.3 mL Soaj injection  Commonly known as:  EPI-PEN  Inject 0.3 mg into the muscle once.     Magnesium 250 MG Tabs  Take 1 tablet by mouth daily.     MULTI-VITAMINS Tabs  Take 1 tablet by mouth daily.     OVER THE COUNTER MEDICATION  Take 1 tablet by mouth daily. Cinsulin     oxyCODONE 5 MG immediate release tablet  Commonly known as:  Oxy IR/ROXICODONE  Take 1-2 tablets (5-10 mg total) by mouth every 4 (four) hours as needed for breakthrough pain.     pantoprazole 40 MG tablet  Commonly known as:  PROTONIX  Take 40 mg by mouth daily as needed. Reported on 10/13/2015     Potassium Gluconate 595 MG Caps  Take 1 capsule by mouth daily.     pyridoxine 100 MG tablet  Commonly known as:  B-6  Take 100 mg by mouth daily.     VITAMIN B-12 PO  Take 1,000 mcg by mouth daily.     Vitamin D3 2000 units capsule  Take 2,000 Units by mouth daily.     ZANAFLEX 4 MG capsule  Generic drug:  tiZANidine  Take 4 mg by mouth daily.        Diagnostic Studies: Dg Chest 2 View  12/09/2015  CLINICAL DATA:  Pre-operative examination. Pt is having a partial knee replacement on July 13. No chest complaints. No hx of heart or lung problems. Pt is a nonsmoker. EXAM: CHEST  2 VIEW COMPARISON:  None. FINDINGS: The cardiac silhouette is normal in size and configuration. There is a small hiatal hernia. No mediastinal or hilar masses or evidence of adenopathy. The lungs are clear.  No pleural effusion or pneumothorax. Bony thorax is demineralized but intact. IMPRESSION: No active cardiopulmonary disease. Electronically Signed   By: Lajean Manes M.D.   On: 12/09/2015 09:40   Dg Knee Right Port  12/17/2015  CLINICAL DATA:  Status post right knee  hemiarthroplasty. EXAM: PORTABLE RIGHT KNEE - 1-2 VIEW COMPARISON:  None. FINDINGS: The medial femoral condyle and tibial plateau prosthetic components appear well seated and aligned. There is no acute fracture or evidence of an operative complication. IMPRESSION: Well-positioned medial compartment right knee hemiarthroplasty. Electronically Signed   By: Lajean Manes M.D.   On: 12/17/2015 12:23   Dg Foot Complete Left  12/08/2015  See clinic note   Disposition: Pt is stable and ready for discharge home pending urinating without the foley cath.  Lovenox has been  called into his pharmacy via Care Management.  Follow-up in 10-14 days with [facility].      Follow-up Information    Follow up with Judson Roch, PA-C In 14 days.   Specialty:  Physician Assistant   Why:  For suture removal, For wound re-check   Contact information:   [place] [address] [phone]      Signed: Judson Roch PA-C 12/18/2015, 8:00 AM

## 2015-12-18 NOTE — Discharge Instructions (Signed)

## 2015-12-18 NOTE — Care Management (Signed)
Walker in room . Clarified with Dr Roland Rack home on Lovenox 40mg  daily x 14 days which is correct . RX is at patient pharmacy.[facility] notified of discharge today. Will sign off.

## 2015-12-18 NOTE — Progress Notes (Signed)
Clinical Social Worker (CSW) received SNF consult. PT is recommending home health. RN Case Manager is aware of above. Please reconsult if future social work needs arise. CSW signing off.   Sherel Fennell, LCSW [phone] 

## 2015-12-18 NOTE — Progress Notes (Signed)
Subjective: 1 Day Post-Op Procedure(s) (LRB): UNICOMPARTMENTAL KNEE (Right) Patient reports pain as moderate and aching in nature.   Patient is well, and has had no acute complaints or problems Plan is to go Home after hospital stay. Negative for chest pain and shortness of breath Fever: no Gastrointestinal:Positive for nausea however not vomiting.  Objective: Vital signs in last 24 hours: Temp:  [96.1 F (35.6 C)-98.5 F (36.9 C)] 97.6 F (36.4 C) (07/14 0455) Pulse Rate:  [52-81] 67 (07/14 0455) Resp:  [14-19] 19 (07/14 0455) BP: (133-163)/(70-94) 146/80 mmHg (07/14 0455) SpO2:  [97 %-100 %] 98 % (07/14 0455) Weight:  [87.12 kg (192 lb 1 oz)] 87.12 kg (192 lb 1 oz) (07/13 1103)  Intake/Output from previous day:  Intake/Output Summary (Last 24 hours) at 12/18/15 0754 Last data filed at 12/18/15 0254  Gross per 24 hour  Intake 2541.67 ml  Output   2260 ml  Net 281.67 ml    Intake/Output this shift:    Labs:  Recent Labs  12/18/15 0542  HGB 13.8    Recent Labs  12/18/15 0542  WBC 9.1  RBC 4.80  HCT 40.5  PLT 157    Recent Labs  12/18/15 0542  NA 133*  K 4.0  CL 102  CO2 26  BUN 12  CREATININE 0.88  GLUCOSE 169*  CALCIUM 8.4*   No results for input(s): LABPT, INR in the last 72 hours.   EXAM General - Patient is Alert, Appropriate and Oriented Extremity - ABD soft Sensation intact distally Intact pulses distally Dorsiflexion/Plantar flexion intact Incision: dressing C/D/I No cellulitis present Dressing/Incision - clean, dry, no drainage, honeycomb dressing intact Motor Function - intact, moving foot and toes well on exam.   Negative Homan's to right leg Abdomen soft and non-tender, normal BS.  Past Medical History  Diagnosis Date  . GERD (gastroesophageal reflux disease)   . DJD (degenerative joint disease)   . Leg cramps   . Cataract   . DDD (degenerative disc disease), lumbar   . Heartburn   . Arthritis   . BPH with  obstruction/lower urinary tract symptoms   . Hypogonadism in male   . Sleep apnea     patient denies  . HH (hiatus hernia)   . Cancer ([place])   . Prostate cancer ([place])    Assessment/Plan: 1 Day Post-Op Procedure(s) (LRB): UNICOMPARTMENTAL KNEE (Right) Active Problems:   Status post right partial knee replacement  Estimated body mass index is 26.8 kg/(m^2) as calculated from the following:   Height as of this encounter: 5\' 11"  (1.803 m).   Weight as of this encounter: 87.12 kg (192 lb 1 oz). Advance diet Up with therapy D/C IV fluids   D/C IVF, Na 133.  Encouraged PO intake. Pt will need to urinate prior to discharge today. Plan will be for discharge following PT sessions today. Follow-up with [facility] in 10-14 days.  DVT Prophylaxis - Lovenox, Foot Pumps and TED hose Weight-Bearing as tolerated to right leg  J. Cameron Proud, PA-C [facility] Orthopa[facility] Surgery 12/18/2015, 7:54 AM

## 2016-04-06 ENCOUNTER — Emergency Department: Payer: Medicare Other

## 2016-04-06 ENCOUNTER — Encounter: Payer: Self-pay | Admitting: Emergency Medicine

## 2016-04-06 ENCOUNTER — Inpatient Hospital Stay
Admission: EM | Admit: 2016-04-06 | Discharge: 2016-04-08 | DRG: 470 | Disposition: A | Discharge status: Home / Self Care | Payer: Medicare Other | Attending: Surgery | Admitting: Surgery

## 2016-04-06 DIAGNOSIS — Z8546 Personal history of malignant neoplasm of prostate: Secondary | ICD-10-CM | POA: Diagnosis not present

## 2016-04-06 DIAGNOSIS — M25061 Hemarthrosis, right knee: Secondary | ICD-10-CM | POA: Diagnosis present

## 2016-04-06 DIAGNOSIS — Y838 Other surgical procedures as the cause of abnormal reaction of the patient, or of later complication, without mention of misadventure at the time of the procedure: Secondary | ICD-10-CM | POA: Diagnosis present

## 2016-04-06 DIAGNOSIS — K219 Gastro-esophageal reflux disease without esophagitis: Secondary | ICD-10-CM | POA: Diagnosis present

## 2016-04-06 DIAGNOSIS — N401 Enlarged prostate with lower urinary tract symptoms: Secondary | ICD-10-CM | POA: Diagnosis present

## 2016-04-06 DIAGNOSIS — M25561 Pain in right knee: Secondary | ICD-10-CM

## 2016-04-06 DIAGNOSIS — M9711XA Periprosthetic fracture around internal prosthetic right knee joint, initial encounter: Secondary | ICD-10-CM | POA: Diagnosis present

## 2016-04-06 DIAGNOSIS — T84022A Instability of internal right knee prosthesis, initial encounter: Secondary | ICD-10-CM | POA: Diagnosis present

## 2016-04-06 DIAGNOSIS — Z96651 Presence of right artificial knee joint: Secondary | ICD-10-CM

## 2016-04-06 DIAGNOSIS — G4730 Sleep apnea, unspecified: Secondary | ICD-10-CM | POA: Diagnosis present

## 2016-04-06 DIAGNOSIS — R2689 Other abnormalities of gait and mobility: Secondary | ICD-10-CM

## 2016-04-06 DIAGNOSIS — T84498A Other mechanical complication of other internal orthopedic devices, implants and grafts, initial encounter: Secondary | ICD-10-CM | POA: Diagnosis present

## 2016-04-06 LAB — BASIC METABOLIC PANEL
ANION GAP: 5 (ref 5–15)
BUN: 17 mg/dL (ref 6–20)
CHLORIDE: 110 mmol/L (ref 101–111)
CO2: 25 mmol/L (ref 22–32)
CREATININE: 1.02 mg/dL (ref 0.61–1.24)
Calcium: 8.7 mg/dL — ABNORMAL LOW (ref 8.9–10.3)
GFR calc non Af Amer: >60 mL/min (ref >60)
GLUCOSE: 135 mg/dL — AB (ref 65–99)
Potassium: 4.1 mmol/L (ref 3.5–5.1)
Sodium: 140 mmol/L (ref 135–145)

## 2016-04-06 LAB — CBC WITH DIFFERENTIAL/PLATELET
Basophils Absolute: 0 10*3/uL (ref 0–0.1)
Basophils Relative: 1 %
EOS PCT: 1 %
Eosinophils Absolute: 0.1 10*3/uL (ref 0–0.7)
HCT: 40.4 % (ref 40.0–52.0)
Hemoglobin: 13.5 g/dL (ref 13.0–18.0)
LYMPHS ABS: 1.6 10*3/uL (ref 1.0–3.6)
LYMPHS PCT: 24 %
MCH: 27.4 pg (ref 26.0–34.0)
MCHC: 33.4 g/dL (ref 32.0–36.0)
MCV: 82 fL (ref 80.0–100.0)
MONO ABS: 0.5 10*3/uL (ref 0.2–1.0)
MONOS PCT: 8 %
Neutro Abs: 4.6 10*3/uL (ref 1.4–6.5)
Neutrophils Relative %: 66 %
PLATELETS: 195 10*3/uL (ref 150–440)
RBC: 4.93 MIL/uL (ref 4.40–5.90)
RDW: 14.1 % (ref 11.5–14.5)
WBC: 6.9 10*3/uL (ref 3.8–10.6)

## 2016-04-06 LAB — PROTIME-INR
INR: 0.98
Prothrombin Time: 13 seconds (ref 11.4–15.2)

## 2016-04-06 LAB — MRSA PCR SCREENING: MRSA BY PCR: NEGATIVE

## 2016-04-06 MED ORDER — MORPHINE SULFATE (PF) 2 MG/ML IV SOLN: 2.0000 mg | INTRAVENOUS | Status: DC | PRN

## 2016-04-06 MED ORDER — CEFAZOLIN SODIUM-DEXTROSE 2-4 GM/100ML-% IV SOLN
2.0000 g | Freq: Once | INTRAVENOUS | Status: AC
  Administered 2016-04-07: 2 g via INTRAVENOUS
  Filled 2016-04-06 (×2): qty 100

## 2016-04-06 MED ORDER — ACETAMINOPHEN 325 MG PO TABS: 650.0000 mg | ORAL_TABLET | Freq: Four times a day (QID) | ORAL | Status: DC | PRN

## 2016-04-06 MED ORDER — MORPHINE SULFATE (PF) 2 MG/ML IV SOLN
4.0000 mg | INTRAVENOUS | Status: DC | PRN
Start: 2016-04-06 — End: 2016-04-07
  Administered 2016-04-06 (×2): 4 mg via INTRAVENOUS
  Filled 2016-04-06 (×3): qty 2

## 2016-04-06 MED ORDER — SODIUM CHLORIDE 0.9 % IV SOLN
INTRAVENOUS | Status: DC
  Administered 2016-04-06 – 2016-04-07 (×2): via INTRAVENOUS

## 2016-04-06 MED ORDER — MORPHINE SULFATE (PF) 2 MG/ML IV SOLN
4.0000 mg | Freq: Once | INTRAVENOUS | Status: AC
  Administered 2016-04-06: 4 mg via INTRAVENOUS

## 2016-04-06 MED ORDER — OXYCODONE HCL 5 MG PO TABS
5.0000 mg | ORAL_TABLET | ORAL | Status: DC | PRN
Start: 2016-04-06 — End: 2016-04-07
  Administered 2016-04-06 – 2016-04-07 (×3): 10 mg via ORAL
  Filled 2016-04-06 (×3): qty 2

## 2016-04-06 MED ORDER — ACETAMINOPHEN 650 MG RE SUPP: 650.0000 mg | Freq: Four times a day (QID) | RECTAL | Status: DC | PRN

## 2016-04-06 NOTE — ED Provider Notes (Signed)
[facility] Emergency Department Provider Note  ____________________________________________   First MD Initiated Contact with Patient 04/06/16 1427     (approximate)  I have reviewed the triage vital signs and the nursing notes.   HISTORY  Chief Complaint Knee Pain    HPI Alexander Woods is a 70 y.o. male who presents by EMS for evaluation of acute onset right knee pain.  He had a mechanical fall today; he was walking in the yard doing some work and stepped into approximately an 8 inch deep hole and felt a pop as he twisted and fell to the side.  He had severe pain from that point on, much worse with any attempt at weightbearing or movement.  He has no pain above or below the knee and is able to wiggle his toes without any difficulty.  He did not strike his head and did not lose consciousness.  His history is significant for a partial knee replacement on that side just about 4 months ago by Dr. Roland Rack.  He is generally healthy for his age and active.   Past Medical History:  Diagnosis Date  . Arthritis   . BPH with obstruction/lower urinary tract symptoms   . Cancer ([place])   . Cataract   . DDD (degenerative disc disease), lumbar   . DJD (degenerative joint disease)   . GERD (gastroesophageal reflux disease)   . Heartburn   . HH (hiatus hernia)   . Hypogonadism in male   . Leg cramps   . Prostate cancer ([place])   . Sleep apnea    patient denies    Patient Active Problem List   Diagnosis Date Noted  . Internal orthopedic device mechanical complication, initial encounter ([place]) 04/06/2016  . Status post right partial knee replacement 12/17/2015  . History of prostate cancer 10/13/2015  . BPH with obstruction/lower urinary tract symptoms 10/13/2015  . H/O nutritional disorder 03/31/2015  . Degeneration of intervertebral disc of lumbar region 11/14/2014  . Neuritis or radiculitis due to rupture of lumbar intervertebral disc 06/09/2014  . Lumbar canal  stenosis 06/09/2014  . LBP (low back pain) 01/24/2014  . Acid reflux 11/08/2013    Past Surgical History:  Procedure Laterality Date  . [place] STUDY N/A 04/15/2015   Procedure: [place] STUDY;  Surgeon: Josefine Class, MD;  Location: [facility] ENDOSCOPY;  Service: Endoscopy;  Laterality: N/A;  . APPENDECTOMY    . bLERETHEPLASTY    . ESOPHAGEAL MANOMETRY N/A 04/15/2015   Procedure: ESOPHAGEAL MANOMETRY (EM);  Surgeon: Josefine Class, MD;  Location: [facility] ENDOSCOPY;  Service: Endoscopy;  Laterality: N/A;  . ESOPHAGOGASTRODUODENOSCOPY (EGD) WITH PROPOFOL N/A 02/12/2015   Procedure: ESOPHAGOGASTRODUODENOSCOPY (EGD) WITH PROPOFOL;  Surgeon: Josefine Class, MD;  Location: [facility] ENDOSCOPY;  Service: Endoscopy;  Laterality: N/A;  . INCISION TENDON SHEATH FOR TRIGGER FINGER    . PARTIAL KNEE ARTHROPLASTY Right 12/17/2015   Procedure: UNICOMPARTMENTAL KNEE;  Surgeon: Corky Mull, MD;  Location: [facility] ORS;  Service: Orthopedics;  Laterality: Right;  . PLANTAR FASCITIS     . PROSTATE SURGERY    . TONSILLECTOMY    . TURP VAPORIZATION      Prior to Admission medications   Medication Sig Start Date End Date Taking? Authorizing Provider  pantoprazole (PROTONIX) 40 MG tablet Take 40 mg by mouth daily as needed. Reported on 10/13/2015 02/12/15  Yes Historical Provider, MD  tiZANidine (ZANAFLEX) 4 MG capsule Take 4 mg by mouth at bedtime.  Yes Historical Provider, MD  aspirin 81 MG tablet Take 81 mg by mouth daily.    Historical Provider, MD  calcium-vitamin D (OSCAL WITH D) 500-200 MG-UNIT per tablet Take 1 tablet by mouth daily with breakfast. Reported on 10/13/2015    Historical Provider, MD  Cholecalciferol (VITAMIN D3) 2000 units capsule Take 2,000 Units by mouth daily.     Historical Provider, MD  Cyanocobalamin (VITAMIN B-12 PO) Take 1,000 mcg by mouth daily.    Historical Provider, MD  EPINEPHrine 0.3 mg/0.3 mL IJ SOAJ injection Inject 0.3 mg into the muscle once.    Historical Provider, MD   Magnesium 250 MG TABS Take 1 tablet by mouth daily.     Historical Provider, MD  Multiple Vitamin (MULTI-VITAMINS) TABS Take 1 tablet by mouth daily.     Historical Provider, MD  OVER THE COUNTER MEDICATION Take 1 tablet by mouth daily. Cinsulin    Historical Provider, MD  Potassium Gluconate 595 MG CAPS Take 1 capsule by mouth daily.    Historical Provider, MD  pyridoxine (B-6) 100 MG tablet Take 100 mg by mouth daily.    Historical Provider, MD    Allergies Bacitracin; Bee venom; Ciprofloxacin; Erythromycin ethylsuccinate; Monosodium glutamate; Fentanyl; and Enoxaparin  Family History  Problem Relation Age of Onset  . Stroke Father   . Heart disease Mother     Aortic Tear  . Heart failure Mother   . Kidney disease Neg Hx   . Prostate cancer Neg Hx     Social History Social History  Substance Use Topics  . Smoking status: Never Smoker  . Smokeless tobacco: Never Used  . Alcohol use No    Review of Systems Constitutional: No fever/chills Eyes: No visual changes. ENT: No sore throat. Cardiovascular: Denies chest pain. Respiratory: Denies shortness of breath. Gastrointestinal: No abdominal pain.  No nausea, no vomiting.  No diarrhea.  No constipation. Genitourinary: Negative for dysuria. Musculoskeletal: Severe right knee pain, no pain above or below the injury Skin: Negative for rash. Neurological: Negative for headaches, focal weakness or numbness.  10-point ROS otherwise negative.  ____________________________________________   PHYSICAL EXAM:  VITAL SIGNS: ED Triage Vitals  Enc Vitals Group     BP 04/06/16 1435 (!) 162/99     Pulse Rate 04/06/16 1435 77     Resp 04/06/16 1435 20     Temp 04/06/16 1435 98.5 F (36.9 C)     Temp Source 04/06/16 1435 Oral     SpO2 04/06/16 1435 100 %     Weight 04/06/16 1436 195 lb (88.5 kg)     Height 04/06/16 1436 5\' 11"  (1.803 m)     Head Circumference --      Peak Flow --      Pain Score 04/06/16 1436 8     Pain Loc  --      Pain Edu? --      Excl. in [place]? --     Constitutional: Alert and oriented. Well appearing and in no acute distress. Eyes: Conjunctivae are normal. PERRL. EOMI. Head: Atraumatic. Nose: No congestion/rhinnorhea. Mouth/Throat: Mucous membranes are moist.  Oropharynx non-erythematous. Neck: No stridor.  No meningeal signs.  No cervical spine tenderness to palpation. Cardiovascular: Normal rate, regular rhythm. Good peripheral circulation. Grossly normal heart sounds. Respiratory: Normal respiratory effort.  No retractions. Lungs CTAB. Gastrointestinal: Soft and nontender. No distention.  Musculoskeletal: Swelling of the right knee with suprapatellar effusion.  Severe tenderness to palpation and with any attempts at flexion or extension  of the joint.  There is no tenderness to palpation of the femur or of the tibia/fibula and the patient is neurovascularly intact below the knee.  He is able to wiggle his toes without difficulty and has good distal pulses. Neurologic:  Normal speech and language. No gross focal neurologic deficits are appreciated.  Skin:  Skin is warm, dry and intact. No rash noted. Psychiatric: Mood and affect are normal. Speech and behavior are normal.  ____________________________________________   LABS (all labs ordered are listed, but only abnormal results are displayed)  Labs Reviewed  BASIC METABOLIC PANEL - Abnormal; Notable for the following:       Result Value   Glucose, Bld 135 (*)    Calcium 8.7 (*)    All other components within normal limits  CBC WITH DIFFERENTIAL/PLATELET  PROTIME-INR  CBC  BASIC METABOLIC PANEL  PROTIME-INR   ____________________________________________  EKG  ED ECG REPORT I, Donnie Panik, the attending physician, personally viewed and interpreted this ECG.  Date: 04/06/2016 EKG Time: 16:06 Rate: 76 Rhythm: normal sinus rhythm QRS Axis: normal Intervals: normal ST/T Wave abnormalities: normal Conduction  Disturbances: none Narrative Interpretation: unremarkable  ____________________________________________  RADIOLOGY   Dg Chest Portable 1 View  Result Date: 04/06/2016 CLINICAL DATA:  Pre-op respiratory exam. Prostate cancer. Gastroesophageal reflux disease and hiatal hernia. EXAM: PORTABLE CHEST 1 VIEW COMPARISON:  12/09/2015 FINDINGS: The heart size and mediastinal contours are within normal limits. Both lungs are clear. No evidence of pleural effusion or pneumothorax. Small hiatal hernia again noted. IMPRESSION: No active cardiopulmonary disease.  Small hiatal hernia. Electronically Signed   By: Earle Gell M.D.   On: 04/06/2016 16:41   Dg Knee Complete 4 Views Right  Result Date: 04/06/2016 CLINICAL DATA:  Stepped in hole, twisted right knee EXAM: RIGHT KNEE - COMPLETE 4+ VIEW COMPARISON:  12/17/2015 FINDINGS: The patient is status post right knee hemi arthroplasty. There is abnormal appearance of the arthroplasty. The previously noted partially radiolucent joint space device is now all extruded out of the disc space scan demonstrates oblique orientation along the medial aspect of the joint. There is an associated 9 mm fracture fragment adjacent to the medial femoral condyle inferiorly. There is metal on metal appearance of the medial prostatic not evident. Lateral view demonstrates a large joint effusion. There is soft tissue calcification or additional small bone fragment anterior to the proximal tibia. IMPRESSION: 1. Abnormal appearance of the left knee medial hemiarthroplasty with extrusion of previously noted partially radiolucent joint space device, now seen obliquely oriented along the medial joint margin. There is an associated fracture fragment, suspected to be from the distal, medial articular surface of the femur. There is metal on metal appearance of the prosthesis. 2. Large probable hemarthrosis in the suprapatellar compartment. Electronically Signed   By: Donavan Foil M.D.   On:  04/06/2016 15:40    ____________________________________________   PROCEDURES  Procedure(s) performed:   Procedures   Critical Care performed: No ____________________________________________   INITIAL IMPRESSION / ASSESSMENT AND PLAN / ED COURSE  Pertinent labs & imaging results that were available during my care of the patient were reviewed by me and considered in my medical decision making (see chart for details).  I will provide analgesia and obtain a 4 view imaging of the patient's knee, but given his relatively recent surgery I anticipate consultation with orthopedics.   Clinical Course  Value Comment By Time  DG Knee Complete 4 Views Right Paging Dr. Rudene Christians to discuss. Hinda Kehr, MD  11/01 1544   Spoke by phone with Dr. Rudene Christians who reviewed the radiographs.  He said that the injuries are extensive in that the patient will require surgery and will need to stay in the hospital.  He is going to contact Dr. Roland Rack and then one of them will come down to see the patient in the emergency department.  I updated the family.  I have ordered the standard pre-operative lab work, EKG, and portable chest x-ray.  The patient's pain is well-controlled at rest at this time.  I also requested an ice pack for his knee.  He continues to have normal neurovascular status below the level of the injury. Hinda Kehr, MD 11/01 1555  DG Chest Portable 1 View (Reviewed) Hinda Kehr, MD 11/01 1710    ____________________________________________  FINAL CLINICAL IMPRESSION(S) / ED DIAGNOSES  Final diagnoses:  Periprosthetic fracture around internal prosthetic right knee joint, initial encounter     MEDICATIONS GIVEN DURING THIS VISIT:  Medications  morphine 2 MG/ML injection 4 mg (4 mg Intravenous Given 04/06/16 1458)  acetaminophen (TYLENOL) tablet 650 mg (not administered)    Or  acetaminophen (TYLENOL) suppository 650 mg (not administered)  0.9 %  sodium chloride infusion (not administered)    oxyCODONE (Oxy IR/ROXICODONE) immediate release tablet 5-10 mg (not administered)  morphine 2 MG/ML injection 2 mg (not administered)  ceFAZolin (ANCEF) IVPB 2g/100 mL premix (not administered)  morphine 2 MG/ML injection 4 mg (4 mg Intravenous Given 04/06/16 1745)     NEW OUTPATIENT MEDICATIONS STARTED DURING THIS VISIT:  New Prescriptions   No medications on file    Modified Medications   No medications on file    Discontinued Medications   ASCORBIC ACID (VITAMIN C) 1000 MG TABLET    Take 1,000 mg by mouth daily.   OXYCODONE (OXY IR/ROXICODONE) 5 MG IMMEDIATE RELEASE TABLET    Take 1-2 tablets (5-10 mg total) by mouth every 4 (four) hours as needed for breakthrough pain.     Note:  This document was prepared using Dragon voice recognition software and may include unintentional dictation errors.    Hinda Kehr, MD 04/06/16 [phone]

## 2016-04-06 NOTE — ED Notes (Signed)
Ice pack placed to right knee.

## 2016-04-06 NOTE — H&P (Signed)
Subjective:   Patient is a 70 y.o. male presents with Knee pain. Onset of symptoms was abrupt starting 6 hours ago with unchanged course since that time. The pain is located medial knee on the right. Patient describes the pain as sharp continuous and rated as moderate. Pain has been associated with stepping into a hole and feeling a pop in the knee, the holes about 6-10 inches deep and pain was immediate after stepping into this. Patient denies any loss of consciousness or other injury. He has had a recent [place] unicompartmental arthroplasty and x-rays in the emergency room show the polyethylene component has dislocated .  Patient Active Problem List   Diagnosis Date Noted  . Status post right partial knee replacement 12/17/2015  . History of prostate cancer 10/13/2015  . BPH with obstruction/lower urinary tract symptoms 10/13/2015  . H/O nutritional disorder 03/31/2015  . Degeneration of intervertebral disc of lumbar region 11/14/2014  . Neuritis or radiculitis due to rupture of lumbar intervertebral disc 06/09/2014  . Lumbar canal stenosis 06/09/2014  . LBP (low back pain) 01/24/2014  . Acid reflux 11/08/2013   Past Medical History:  Diagnosis Date  . Arthritis   . BPH with obstruction/lower urinary tract symptoms   . Cancer ([place])   . Cataract   . DDD (degenerative disc disease), lumbar   . DJD (degenerative joint disease)   . GERD (gastroesophageal reflux disease)   . Heartburn   . HH (hiatus hernia)   . Hypogonadism in male   . Leg cramps   . Prostate cancer ([place])   . Sleep apnea    patient denies    Past Surgical History:  Procedure Laterality Date  . [place] STUDY N/A 04/15/2015   Procedure: [place] STUDY;  Surgeon: Josefine Class, MD;  Location: [facility] ENDOSCOPY;  Service: Endoscopy;  Laterality: N/A;  . APPENDECTOMY    . bLERETHEPLASTY    . ESOPHAGEAL MANOMETRY N/A 04/15/2015   Procedure: ESOPHAGEAL MANOMETRY (EM);  Surgeon: Josefine Class, MD;  Location:  [facility] ENDOSCOPY;  Service: Endoscopy;  Laterality: N/A;  . ESOPHAGOGASTRODUODENOSCOPY (EGD) WITH PROPOFOL N/A 02/12/2015   Procedure: ESOPHAGOGASTRODUODENOSCOPY (EGD) WITH PROPOFOL;  Surgeon: Josefine Class, MD;  Location: [facility] ENDOSCOPY;  Service: Endoscopy;  Laterality: N/A;  . INCISION TENDON SHEATH FOR TRIGGER FINGER    . PARTIAL KNEE ARTHROPLASTY Right 12/17/2015   Procedure: UNICOMPARTMENTAL KNEE;  Surgeon: Corky Mull, MD;  Location: [facility] ORS;  Service: Orthopedics;  Laterality: Right;  . PLANTAR FASCITIS     . PROSTATE SURGERY    . TONSILLECTOMY    . TURP VAPORIZATION       (Not in a hospital admission) Allergies  Allergen Reactions  . Bacitracin Anaphylaxis  . Bee Venom Anaphylaxis  . Ciprofloxacin Anaphylaxis  . Erythromycin Ethylsuccinate Anaphylaxis  . Monosodium Glutamate Anaphylaxis  . Fentanyl     Blood pressure  . Enoxaparin Rash    Social History  Substance Use Topics  . Smoking status: Never Smoker  . Smokeless tobacco: Never Used  . Alcohol use No    Family History  Problem Relation Age of Onset  . Stroke Father   . Heart disease Mother     Aortic Tear  . Heart failure Mother   . Kidney disease Neg Hx   . Prostate cancer Neg Hx     Review of Systems Pertinent items are noted in HPI.  Objective:   Patient Vitals for the past 8 hrs:  BP Temp Temp  src Pulse Resp SpO2 Height Weight  04/06/16 1615 (!) 152/90 - - 72 (!) 25 100 % - -  04/06/16 1445 (!) 167/98 - - 80 12 98 % - -  04/06/16 1436 - - - - - - 5\' 11"  (1.803 m) 88.5 kg (195 lb)  04/06/16 1435 (!) 162/99 98.5 F (36.9 C) Oral 77 20 100 % - -   No intake/output data recorded. No intake/output data recorded.    BP (!) 152/90   Pulse 72   Temp 98.5 F (36.9 C) (Oral)   Resp (!) 25   Ht 5\' 11"  (1.803 m)   Wt 88.5 kg (195 lb)   SpO2 100%   BMI 27.20 kg/m   General Appearance:    Alert, cooperative, no distress, appears stated age  Head:    Normocephalic, without obvious  abnormality, atraumatic        Nose:   Nares normal, septum midline, mucosa normal, no drainage    or sinus tenderness  Throat:   Lips, mucosa, and tongue normal; teeth and gums normal     Back:     Symmetric, no curvature, ROM normal, no CVA tenderness  Lungs:     Clear to auscultation bilaterally, respirations unlabored  Chest wall:    No tenderness or deformity  Heart:    Regular rate and rhythm, S1 and S2 normal, no murmur, rub   or gallop  Abdomen:     Soft, non-tender, bowel sounds active all four quadrants,    no masses, no organomegaly        Extremities:   Right knee has slight varus deformity. He has a well-healed prior scar from a medial arthrotomy. He has some opening to valgus stress but a firm endpoint with pain, no cyanosis or edema  Pulses:   2+ and symmetric all extremities  Skin:   Skin color, texture, turgor normal, no rashes or lesions. Well-healed scar medial aspect of right knee. Sensation intact distally with palpable pulses            Data ReviewRadiology review: The polyethylene component has radiolucent markers and shows dislocation medially  Assessment:  Complication of orthopedic device, dislocation of polyethylene spacer  Plan:   Plan is to mobilize the knee at this point and plan on revision of polyethylene component tomorrow by Dr. Roland Rack. I discussed this with him

## 2016-04-06 NOTE — ED Triage Notes (Signed)
Pt to ED via [facility] after stepping in hole in the yard while raking leaves in yard and he twisted his R knee.  Of note, pt had total knee replacement in July of this year.  Pt was given total of 8mg  of IV Morphine in ambulance per MD order.

## 2016-04-06 NOTE — ED Notes (Signed)
Knee immobilizer was applied by ortho MD

## 2016-04-07 ENCOUNTER — Encounter: Payer: Self-pay | Admitting: *Deleted

## 2016-04-07 ENCOUNTER — Inpatient Hospital Stay: Payer: Medicare Other | Admitting: Anesthesiology

## 2016-04-07 ENCOUNTER — Inpatient Hospital Stay: Payer: Medicare Other

## 2016-04-07 ENCOUNTER — Encounter
Admission: EM | Disposition: A | Discharge status: Home / Self Care | Payer: Self-pay | Source: Home / Self Care | Attending: Surgery

## 2016-04-07 HISTORY — PX: PARTIAL KNEE ARTHROPLASTY: SHX2174

## 2016-04-07 SURGERY — ARTHROPLASTY, KNEE, UNICOMPARTMENTAL
Anesthesia: General | Site: Knee | Laterality: Left | Wound class: Clean

## 2016-04-07 MED ORDER — CEFAZOLIN SODIUM-DEXTROSE 2-4 GM/100ML-% IV SOLN
2.0000 g | Freq: Four times a day (QID) | INTRAVENOUS | Status: AC
  Administered 2016-04-07 – 2016-04-08 (×3): 2 g via INTRAVENOUS
  Filled 2016-04-07 (×4): qty 100

## 2016-04-07 MED ORDER — DIPHENHYDRAMINE HCL 12.5 MG/5ML PO ELIX: 12.5000 mg | ORAL_SOLUTION | ORAL | Status: DC | PRN

## 2016-04-07 MED ORDER — LIDOCAINE HCL (CARDIAC) 20 MG/ML IV SOLN
INTRAVENOUS | Status: DC | PRN
  Administered 2016-04-07: 100 mg via INTRAVENOUS

## 2016-04-07 MED ORDER — ONDANSETRON HCL 4 MG/2ML IJ SOLN: 4.0000 mg | Freq: Four times a day (QID) | INTRAMUSCULAR | Status: DC | PRN

## 2016-04-07 MED ORDER — ALUM & MAG HYDROXIDE-SIMETH 200-200-20 MG/5ML PO SUSP
30.0000 mL | Freq: Four times a day (QID) | ORAL | Status: DC | PRN
  Administered 2016-04-07: 30 mL via ORAL
  Filled 2016-04-07: qty 30

## 2016-04-07 MED ORDER — METOCLOPRAMIDE HCL 10 MG PO TABS: 5.0000 mg | ORAL_TABLET | Freq: Three times a day (TID) | ORAL | Status: DC | PRN

## 2016-04-07 MED ORDER — ACETAMINOPHEN 500 MG PO TABS
1000.0000 mg | ORAL_TABLET | Freq: Four times a day (QID) | ORAL | Status: DC
  Administered 2016-04-07 – 2016-04-08 (×3): 1000 mg via ORAL
  Filled 2016-04-07 (×3): qty 2

## 2016-04-07 MED ORDER — ONDANSETRON HCL 4 MG PO TABS: 4.0000 mg | ORAL_TABLET | Freq: Four times a day (QID) | ORAL | Status: DC | PRN

## 2016-04-07 MED ORDER — KETOROLAC TROMETHAMINE 15 MG/ML IJ SOLN: 15.0000 mg | Freq: Once | INTRAMUSCULAR | Status: DC

## 2016-04-07 MED ORDER — ACETAMINOPHEN 10 MG/ML IV SOLN
INTRAVENOUS | Status: DC | PRN
  Administered 2016-04-07: 1000 mg via INTRAVENOUS

## 2016-04-07 MED ORDER — DOCUSATE SODIUM 100 MG PO CAPS
100.0000 mg | ORAL_CAPSULE | Freq: Two times a day (BID) | ORAL | Status: DC
  Administered 2016-04-07: 100 mg via ORAL
  Filled 2016-04-07: qty 1

## 2016-04-07 MED ORDER — ADULT MULTIVITAMIN W/MINERALS CH
1.0000 | ORAL_TABLET | Freq: Every day | ORAL | Status: DC
  Administered 2016-04-07: 1 via ORAL
  Filled 2016-04-07: qty 1

## 2016-04-07 MED ORDER — MAGNESIUM OXIDE 400 (241.3 MG) MG PO TABS
400.0000 mg | ORAL_TABLET | Freq: Every day | ORAL | Status: DC
  Administered 2016-04-07: 400 mg via ORAL
  Filled 2016-04-07: qty 1

## 2016-04-07 MED ORDER — MAGNESIUM HYDROXIDE 400 MG/5ML PO SUSP: 30.0000 mL | Freq: Every day | ORAL | Status: DC | PRN

## 2016-04-07 MED ORDER — LACTATED RINGERS IV SOLN
INTRAVENOUS | Status: DC | PRN
  Administered 2016-04-07: 16:00:00 via INTRAVENOUS

## 2016-04-07 MED ORDER — MENTHOL 3 MG MT LOZG
1.0000 | LOZENGE | OROMUCOSAL | Status: DC | PRN
  Filled 2016-04-07: qty 9

## 2016-04-07 MED ORDER — VITAMIN D 1000 UNITS PO TABS
2000.0000 [IU] | ORAL_TABLET | Freq: Every day | ORAL | Status: DC
  Administered 2016-04-07: 2000 [IU] via ORAL
  Filled 2016-04-07: qty 2

## 2016-04-07 MED ORDER — TIZANIDINE HCL 2 MG PO TABS
4.0000 mg | ORAL_TABLET | Freq: Every day | ORAL | Status: DC
  Administered 2016-04-07: 4 mg via ORAL
  Filled 2016-04-07: qty 2

## 2016-04-07 MED ORDER — OXYCODONE HCL 5 MG PO TABS
5.0000 mg | ORAL_TABLET | ORAL | Status: DC | PRN
  Administered 2016-04-07: 10 mg via ORAL
  Filled 2016-04-07: qty 2

## 2016-04-07 MED ORDER — ACETAMINOPHEN 650 MG RE SUPP: 650.0000 mg | Freq: Four times a day (QID) | RECTAL | Status: DC | PRN

## 2016-04-07 MED ORDER — PANTOPRAZOLE SODIUM 40 MG PO TBEC
40.0000 mg | DELAYED_RELEASE_TABLET | Freq: Every day | ORAL | Status: DC
  Administered 2016-04-07: 40 mg via ORAL
  Filled 2016-04-07: qty 1

## 2016-04-07 MED ORDER — FLEET ENEMA 7-19 GM/118ML RE ENEM: 1.0000 | ENEMA | Freq: Once | RECTAL | Status: DC | PRN

## 2016-04-07 MED ORDER — PROPOFOL 10 MG/ML IV BOLUS
INTRAVENOUS | Status: DC | PRN
  Administered 2016-04-07: 130 mg via INTRAVENOUS

## 2016-04-07 MED ORDER — CEFAZOLIN SODIUM-DEXTROSE 2-4 GM/100ML-% IV SOLN
INTRAVENOUS | Status: AC
  Filled 2016-04-07: qty 100

## 2016-04-07 MED ORDER — VITAMIN B-6 50 MG PO TABS
100.0000 mg | ORAL_TABLET | Freq: Every day | ORAL | Status: DC
  Administered 2016-04-07: 100 mg via ORAL
  Filled 2016-04-07 (×2): qty 1

## 2016-04-07 MED ORDER — ASPIRIN EC 81 MG PO TBEC
81.0000 mg | DELAYED_RELEASE_TABLET | Freq: Every day | ORAL | Status: DC
  Administered 2016-04-07: 81 mg via ORAL
  Filled 2016-04-07: qty 1

## 2016-04-07 MED ORDER — GLYCOPYRROLATE 0.2 MG/ML IJ SOLN
INTRAMUSCULAR | Status: DC | PRN
  Administered 2016-04-07: 0.2 mg via INTRAVENOUS

## 2016-04-07 MED ORDER — KCL IN DEXTROSE-NACL 20-5-0.9 MEQ/L-%-% IV SOLN
INTRAVENOUS | Status: DC
  Administered 2016-04-07 – 2016-04-08 (×2): via INTRAVENOUS
  Filled 2016-04-07 (×4): qty 1000

## 2016-04-07 MED ORDER — CALCIUM CARBONATE-VITAMIN D 500-200 MG-UNIT PO TABS
1.0000 | ORAL_TABLET | Freq: Every day | ORAL | Status: DC
  Administered 2016-04-08: 1 via ORAL
  Filled 2016-04-07: qty 1

## 2016-04-07 MED ORDER — ACETAMINOPHEN 325 MG PO TABS: 650.0000 mg | ORAL_TABLET | Freq: Four times a day (QID) | ORAL | Status: DC | PRN

## 2016-04-07 MED ORDER — SODIUM CHLORIDE 0.9 % IV SOLN
INTRAVENOUS | Status: DC | PRN
  Administered 2016-04-07: 60 mL

## 2016-04-07 MED ORDER — POTASSIUM GLUCONATE 595 MG PO CAPS: 1.0000 | ORAL_CAPSULE | Freq: Every day | ORAL | Status: DC

## 2016-04-07 MED ORDER — ONDANSETRON HCL 4 MG/2ML IJ SOLN: 4.0000 mg | INTRAMUSCULAR | Status: DC | PRN

## 2016-04-07 MED ORDER — VITAMIN B-12 1000 MCG PO TABS
1000.0000 ug | ORAL_TABLET | Freq: Every day | ORAL | Status: DC
  Administered 2016-04-07: 1000 ug via ORAL
  Filled 2016-04-07: qty 1

## 2016-04-07 MED ORDER — MIDAZOLAM HCL 2 MG/2ML IJ SOLN
INTRAMUSCULAR | Status: DC | PRN
  Administered 2016-04-07: 2 mg via INTRAVENOUS

## 2016-04-07 MED ORDER — KETOROLAC TROMETHAMINE 15 MG/ML IJ SOLN
7.5000 mg | Freq: Four times a day (QID) | INTRAMUSCULAR | Status: DC
  Administered 2016-04-07 – 2016-04-08 (×3): 7.5 mg via INTRAVENOUS
  Filled 2016-04-07 (×3): qty 1

## 2016-04-07 MED ORDER — EPHEDRINE SULFATE 50 MG/ML IJ SOLN
INTRAMUSCULAR | Status: DC | PRN
  Administered 2016-04-07: 10 mg via INTRAVENOUS
  Administered 2016-04-07 (×3): 5 mg via INTRAVENOUS
  Administered 2016-04-07: 10 mg via INTRAVENOUS
  Administered 2016-04-07: 5 mg via INTRAVENOUS
  Administered 2016-04-07: 10 mg via INTRAVENOUS

## 2016-04-07 MED ORDER — BISACODYL 10 MG RE SUPP: 10.0000 mg | Freq: Every day | RECTAL | Status: DC | PRN

## 2016-04-07 MED ORDER — FENTANYL CITRATE (PF) 100 MCG/2ML IJ SOLN
INTRAMUSCULAR | Status: DC | PRN
  Administered 2016-04-07 (×2): 50 ug via INTRAVENOUS

## 2016-04-07 MED ORDER — METOCLOPRAMIDE HCL 5 MG/ML IJ SOLN: 5.0000 mg | Freq: Three times a day (TID) | INTRAMUSCULAR | Status: DC | PRN

## 2016-04-07 MED ORDER — HYDROMORPHONE HCL 1 MG/ML IJ SOLN
1.0000 mg | INTRAMUSCULAR | Status: DC | PRN
  Administered 2016-04-07: 2 mg via INTRAVENOUS
  Administered 2016-04-08: 1 mg via INTRAVENOUS
  Filled 2016-04-07: qty 2
  Filled 2016-04-07: qty 1

## 2016-04-07 SURGICAL SUPPLY — 57 items
BANDAGE ACE 6X5 VEL STRL LF (GAUZE/BANDAGES/DRESSINGS) ×3 IMPLANT
BEARING MENISCAL TIBIAL 7 SM R (Orthopedic Implant) ×3 IMPLANT
CANISTER SUCT 1200ML W/VALVE (MISCELLANEOUS) ×3 IMPLANT
CANISTER SUCT 3000ML (MISCELLANEOUS) ×3 IMPLANT
CATH FOL LEG HOLDER (MISCELLANEOUS) ×3 IMPLANT
CATH TRAY METER 16FR LF (MISCELLANEOUS) ×3 IMPLANT
CHLORAPREP W/TINT 26ML (MISCELLANEOUS) ×6 IMPLANT
COOLER POLAR GLACIER W/PUMP (MISCELLANEOUS) ×3 IMPLANT
COVER MAYO STAND STRL (DRAPES) ×3 IMPLANT
CUFF TOURN 24 STER (MISCELLANEOUS) IMPLANT
CUFF TOURN 30 STER DUAL PORT (MISCELLANEOUS) IMPLANT
DRAPE C-ARM XRAY 36X54 (DRAPES) IMPLANT
DRSG OPSITE POSTOP 4X12 (GAUZE/BANDAGES/DRESSINGS) ×3 IMPLANT
DRSG OPSITE POSTOP 4X14 (GAUZE/BANDAGES/DRESSINGS) ×3 IMPLANT
DRSG OPSITE POSTOP 4X6 (GAUZE/BANDAGES/DRESSINGS) ×3 IMPLANT
ELECT CAUTERY BLADE 6.4 (BLADE) ×3 IMPLANT
ELECT REM PT RETURN 9FT ADLT (ELECTROSURGICAL) ×3
ELECTRODE REM PT RTRN 9FT ADLT (ELECTROSURGICAL) ×1 IMPLANT
GAUZE PETRO XEROFOAM 1X8 (MISCELLANEOUS) ×3 IMPLANT
GAUZE SPONGE 4X4 12PLY STRL (GAUZE/BANDAGES/DRESSINGS) ×3 IMPLANT
GLOVE BIO SURGEON STRL SZ7.5 (GLOVE) ×6 IMPLANT
GLOVE BIO SURGEON STRL SZ8 (GLOVE) ×6 IMPLANT
GLOVE BIOGEL PI IND STRL 8 (GLOVE) ×1 IMPLANT
GLOVE BIOGEL PI INDICATOR 8 (GLOVE) ×2
GLOVE INDICATOR 8.0 STRL GRN (GLOVE) ×3 IMPLANT
GOWN STRL REUS W/ TWL LRG LVL3 (GOWN DISPOSABLE) ×2 IMPLANT
GOWN STRL REUS W/ TWL XL LVL3 (GOWN DISPOSABLE) ×1 IMPLANT
GOWN STRL REUS W/TWL LRG LVL3 (GOWN DISPOSABLE) ×4
GOWN STRL REUS W/TWL XL LVL3 (GOWN DISPOSABLE) ×2
GRADUATE 1200CC STRL 31836 (MISCELLANEOUS) ×3 IMPLANT
HANDPIECE INTERPULSE COAX TIP (DISPOSABLE) ×2
HOOD PEEL AWAY FLYTE STAYCOOL (MISCELLANEOUS) ×9 IMPLANT
KIT RM TURNOVER STRD PROC AR (KITS) ×3 IMPLANT
MAT BLUE FLOOR 46X72 FLO (MISCELLANEOUS) ×3 IMPLANT
NDL SAFETY 18GX1.5 (NEEDLE) ×3 IMPLANT
NEEDLE SPNL 20GX3.5 QUINCKE YW (NEEDLE) ×3 IMPLANT
NS IRRIG 1000ML POUR BTL (IV SOLUTION) ×3 IMPLANT
PACK BLADE SAW RECIP 70 3 PT (BLADE) ×3 IMPLANT
PACK TOTAL KNEE (MISCELLANEOUS) ×3 IMPLANT
PAD WRAPON POLAR KNEE (MISCELLANEOUS) ×1 IMPLANT
SET HNDPC FAN SPRY TIP SCT (DISPOSABLE) ×1 IMPLANT
SOL .9 NS 3000ML IRR  AL (IV SOLUTION) ×2
SOL .9 NS 3000ML IRR UROMATIC (IV SOLUTION) ×1 IMPLANT
SPONGE XRAY 4X4 16PLY STRL (MISCELLANEOUS) ×3 IMPLANT
STAPLER SKIN PROX 35W (STAPLE) ×3 IMPLANT
STRAP SAFETY BODY (MISCELLANEOUS) ×3 IMPLANT
SUCTION FRAZIER HANDLE 10FR (MISCELLANEOUS) ×2
SUCTION TUBE FRAZIER 10FR DISP (MISCELLANEOUS) ×1 IMPLANT
SUT VIC AB 0 CT1 36 (SUTURE) ×3 IMPLANT
SUT VIC AB 2-0 CT1 27 (SUTURE) ×8
SUT VIC AB 2-0 CT1 TAPERPNT 27 (SUTURE) ×4 IMPLANT
SYR 20CC LL (SYRINGE) ×3 IMPLANT
SYR 30ML LL (SYRINGE) ×12 IMPLANT
SYR BULB IRRIG 60ML STRL (SYRINGE) ×3 IMPLANT
SYRINGE 10CC LL (SYRINGE) ×3 IMPLANT
TAPE TRANSPORE STRL 2 31045 (GAUZE/BANDAGES/DRESSINGS) ×3 IMPLANT
WRAPON POLAR PAD KNEE (MISCELLANEOUS) ×3

## 2016-04-07 NOTE — Progress Notes (Signed)
Cepacol ordered for sore throat per Dr. Rudene Christians.

## 2016-04-07 NOTE — Op Note (Addendum)
04/06/2016 - 04/07/2016  4:48 PM  Patient:   Alexander Woods  Pre-Op Diagnosis:   Dislocated medial insert status post UKA, right knee.  Post-Op Diagnosis:   Same  Procedure:   Revision right unicondylar knee arthroplasty with polyethylene exchange.  Surgeon:   Pascal Lux, MD  Assistant:   None  Anesthesia:   General LMA  Findings:   As above.  Complications:   None  EBL:   50 cc  Fluids:   1100 cc crystalloid  UOP:   None  TT:   53 minutes at 300 mmHg  Drains:   None  Closure:   Staples  Implants:   A 7 mm meniscal bearing insert.  Brief Clinical Note:   The patient is a 70 year old male who is now 3.5 months status post a right UKA. The patient was doing quite well and had resumed all of his normal daily activities. Yesterday, while clearing his mother-in-law's lawn, he stepped into a 10-12 inch hole, wrenching his knee. He was brought to the emergency room where x-rays of his right knee demonstrated that the polyethylene insert had dislocated anteromedially. The patient presents at this time for a revision partial knee replacement with polyethylene exchange.  Procedure:   The patient was brought into the operating room and was lain in the supine position. After adequate general laryngeal mask anesthesia was obtained, the patient was repositioned so that the non-surgical leg was placed in a flexed and abducted position in the yellow fin leg holder while the surgical extremity was placed over the Biomet leg holder. The right lower extremity was prepped with ChloraPrep solution before being draped sterilely. Preoperative antibiotics were administered. After performing a timeout to verify the appropriate surgical site, the limb was exsanguinated with an Esmarch and the tourniquet inflated to 300 mmHg. The previous incision anteriorly was reopened and carried down through the subcutaneous tissues to expose the superficial retinaculum. This was split the length the incision  and medial flap elevated sufficiently to expose the medial retinaculum. This was incised along the medial border of the patella tendon and extended proximally along the medial border of the patella, leaving a 3-4 mm cuff of tissue. The soft tissues were elevated off the anteromedial aspect of the proximal tibia. The 6 mm meniscal bearing insert was removed and inspected. It did not appear to be damaged. The knee was irrigated thoroughly with sterile saline solution in order to lavage out and a hemarthrosis. Several trial reductions were performed using both the C6 and 7 mm meniscal bearing inserts. The 7 mm meniscal bearing insert appeared to fit more snugly yet not be too tight so this was selected.   At this point, a total of 20 cc of Exparel diluted out to 60 cc with normal saline and 30 cc of 0.5% Sensorcaine was injected in and around the posterior and medial capsular tissues, as well as the peri-incisional tissues to help with postoperative pain control. The permanent 7 mm meniscal bearing insert was positioned and snapped into place. The knee was placed through a range of motion, demonstrating excellent tracking of the meniscal bearing inserted without any tendency for subluxation or dislocation.   The wound was copiously irrigated with bacitracin saline solution via a bulb irrigation system before the retinacular layer was reapproximated using #0 Vicryl interrupted sutures. The subcutaneous tissues were closed in two layers using 2-0 Vicryl interrupted sutures before the skin was closed using staples. A sterile occlusive dressing was applied to the knee before  the patient was a, extubated, andreturned to the recovery room in satisfactory condition after tolerating the procedure well.

## 2016-04-07 NOTE — Anesthesia Preprocedure Evaluation (Signed)
Anesthesia Evaluation  Patient identified by MRN, date of birth, ID band Patient awake    Reviewed: Allergy & Precautions, H&P , NPO status , Patient's Chart, lab work & pertinent test results, reviewed documented beta blocker date and time   Airway Mallampati: II  TM Distance: >3 FB Neck ROM: full    Dental  (+) Teeth Intact   Pulmonary neg pulmonary ROS, sleep apnea ,    Pulmonary exam normal        Cardiovascular Exercise Tolerance: Good negative cardio ROS Normal cardiovascular exam Rhythm:regular Rate:Normal     Neuro/Psych  Neuromuscular disease negative neurological ROS  negative psych ROS   GI/Hepatic negative GI ROS, Neg liver ROS, hiatal hernia, GERD  Medicated,  Endo/Other  negative endocrine ROS  Renal/GU negative Renal ROS  negative genitourinary   Musculoskeletal   Abdominal   Peds  Hematology negative hematology ROS (+)   Anesthesia Other Findings   Reproductive/Obstetrics negative OB ROS                             Anesthesia Physical Anesthesia Plan  ASA: II  Anesthesia Plan: General LMA   Post-op Pain Management:    Induction:   Airway Management Planned:   Additional Equipment:   Intra-op Plan:   Post-operative Plan:   Informed Consent: I have reviewed the patients History and Physical, chart, labs and discussed the procedure including the risks, benefits and alternatives for the proposed anesthesia with the patient or authorized representative who has indicated his/her understanding and acceptance.     Plan Discussed with: CRNA  Anesthesia Plan Comments:         Anesthesia Quick Evaluation

## 2016-04-07 NOTE — Progress Notes (Signed)
Pt. C/o indigestion. Dr. Rudene Christians ordered maalox 30cc q6h prn

## 2016-04-07 NOTE — Progress Notes (Signed)
Pt left the unit for the Or via his bed at 1310.

## 2016-04-07 NOTE — OR Nursing (Signed)
Ancef 2 gm pulled from sds preop pixis wasted as already spiked after noticing floor nurse had sent one already to preop

## 2016-04-07 NOTE — Progress Notes (Signed)
Shift assessment completed at 0830. Pt in no distress, did state that he had been having some nausea, stated he was not nauseous at time of assessment. Pt is alert and oriented, lungs are clear bilat, pt is on room air. S1S2 auscultated. Abdomen is soft, bs heard. Pt denied difficulty voiding. R leg has knee immobilizer in place, pt is able to rotate his foot, foot is warm, cap refill is wnl, and ppp. Pt stated that pain is at a tolerable level. PIV intact to lac with ns infusing at 4mls/hr, site is free of redness and swelling. Report has been given ot OR.

## 2016-04-07 NOTE — Progress Notes (Signed)
Pt arrived from pacu at approx 1800 via his bed. Pt is alert and oriented, in no distress, denied pain. Pt remains on room air, lungs are clear bilat, S1S2 heard. Abdomen is soft, bs heard. R knee had honeycomb dressing intact to kneecap, scant drainage under this, knee is edematous. cpm is placed on pt, tolerating well. Pt is given clear liquids. Call bell in reach.

## 2016-04-07 NOTE — Anesthesia Procedure Notes (Signed)
Procedure Name: LMA Insertion Date/Time: 04/07/2016 3:18 PM Performed by: Aline Brochure Pre-anesthesia Checklist: Patient identified, Emergency Drugs available, Suction available and Patient being monitored Patient Re-evaluated:Patient Re-evaluated prior to inductionOxygen Delivery Method: Circle system utilized Preoxygenation: Pre-oxygenation with 100% oxygen Intubation Type: IV induction Ventilation: Mask ventilation without difficulty LMA: LMA inserted LMA Size: 4.5 Number of attempts: 1 Placement Confirmation: breath sounds checked- equal and bilateral and positive ETCO2 Tube secured with: Tape Dental Injury: Teeth and Oropharynx as per pre-operative assessment

## 2016-04-07 NOTE — Clinical Social Work Note (Signed)
Clinical Social Work Assessment  Patient Details  Name: Alexander Woods MRN: [id number] Date of Birth: [birth year]  Date of referral:  04/07/16               Reason for consult:  Discharge Planning                Permission sought to share information with:  Case Manager Permission granted to share information::  Yes, Verbal Permission Granted  Name::        Agency::     Relationship::     Contact Information:     Housing/Transportation Living arrangements for the past 2 months:  Single Family Home Source of Information:  Patient, Spouse Patient Interpreter Needed:  None Criminal Activity/Legal Involvement Pertinent to Current Situation/Hospitalization:  No - Comment as needed Significant Relationships:  Spouse Lives with:  Spouse Do you feel safe going back to the place where you live?  Yes Need for family participation in patient care:  Yes (Comment)  Care giving concerns:  Patient lives at home with his wife in [place].    Social Worker assessment / plan:  Holiday representative (CSW) viewed the chart and noted that patient was having surgery today. Social work Theatre manager met with patient and patient's wife at bedside. Patient was sitting up in bed. Patient was alert and oriented. Per patient, he lives at home with his wife in [place]. Patient works from home part-time. Social work Theatre manager explained that PT will work with patient after surgery and they will determine if patient will need a to go to SNF for short-term rehab or home and receive home health. Social work Theatre manager also explained to patient and patient's wife that with patient's insurance for Medicare, an three night inpatient stay is required for patient insurnace to pay for a SNF if needed. Patient and patient's wife have both agreed they want patient to go home after being discharged. Social work Theatre manager will notify Tourist information centre manager of above.   Employment status:  Psychologist, counselling:  Medicare PT Recommendations:  Not  assessed at this time Information / Referral to community resources:     Patient/Family's Response to care:  Patient and patient's wife want's patient to go home after discharge.   Patient/Family's Understanding of and Emotional Response to Diagnosis, Current Treatment, and Prognosis:  Patient and patient's wife was very pleasant and thanked social work Theatre manager for coming by.   Emotional Assessment Appearance:  Appears stated age Attitude/Demeanor/Rapport:    Affect (typically observed):  Accepting, Adaptable, Appropriate Orientation:  Oriented to Self, Oriented to Place, Oriented to  Time, Oriented to Situation Alcohol / Substance use:  Not Applicable Psych involvement (Current and /or in the community):  No (Comment)  Discharge Needs  Concerns to be addressed:  Discharge Planning Concerns, Basic Needs Readmission within the last 30 days:  No Current discharge risk:  Dependent with Mobility Barriers to Discharge:  Continued Medical Work up   [employer], [place] Work 04/07/2016, 10:22 AM

## 2016-04-07 NOTE — OR Nursing (Signed)
Cefazolin 2 gm sent to OR with patient

## 2016-04-07 NOTE — Transfer of Care (Signed)
Immediate Anesthesia Transfer of Care Note  Patient: Alexander Woods  Procedure(s) Performed: Procedure(s): UNICOMPARTMENTAL KNEE/ REVISION POLYETHYLENE (Left)  Patient Location: PACU  Anesthesia Type:General  Level of Consciousness: sedated  Airway & Oxygen Therapy: Patient connected to face mask oxygen  Post-op Assessment: Post -op Vital signs reviewed and stable  Post vital signs: stable  Last Vitals:  Vitals:   04/07/16 1315 04/07/16 1651  BP: (!) 155/97 (!) 142/74  Pulse: (!) 56 61  Resp: 16 14  Temp: 36.8 C 36.4 C    Last Pain:  Vitals:   04/07/16 1651  TempSrc: Temporal  PainSc:          Complications: No apparent anesthesia complications

## 2016-04-08 MED ORDER — OXYCODONE HCL 5 MG PO TABS: 5.0000 mg | ORAL_TABLET | ORAL | 0 refills | Status: DC | PRN

## 2016-04-08 NOTE — Progress Notes (Signed)
Pt. Alert and oriented. VSS. Pain controlled with meds per MAR. Pt. Tolerating CPM with the help of pain meds all night. CPM up to 60 degrees now. Ice pack in place. Pt. Voiding in urinal. Resting quietly at this time.Will continue to monitor.

## 2016-04-08 NOTE — Care Management Note (Signed)
Case Management Note  Patient Details  Name: Alexander Woods MRN: [id number] Date of Birth: [birth year]  Subjective/Objective:  Met with patient at bedside to discuss discharge. He lives at home with his wife who is his caregiver. Prior to admission, patient was driving, independent with adls and used no DME. He does have a walker, cane and crutches if needed. He prefers to use the home health agency he used for his knee replacement in July. Referral to [facility]. Patient will not be discharged home on Lovenox. No other needs identified.                  Action/Plan: [facility] for PT  Expected Discharge Date:  04/08/16               Expected Discharge Plan:  [place]  In-House Referral:     Discharge planning Services  CM Consult  Post Acute Care Choice:  Home Health Choice offered to:  Patient  DME Arranged:    DME Agency:     HH Arranged:  PT [place]:  [facility] (now [facility])  Status of Service:  Completed, signed off  If discussed at [employer] of Stay Meetings, dates discussed:    Additional Comments:  Jolly Mango, RN 04/08/2016, 9:42 AM

## 2016-04-08 NOTE — Discharge Summary (Signed)
Physician Discharge Summary  Patient ID: Alexander Woods MRN: [id number] DOB/AGE: 11/06/1945 70 y.o.  Admit date: 04/06/2016 Discharge date: 04/08/2016  Admission Diagnoses:  Periprosthetic fracture around internal prosthetic right knee joint, initial encounter [M97.11XA] Dislocated medial insert status post UKA, right knee  Discharge Diagnoses: Patient Active Problem List   Diagnosis Date Noted  . Internal orthopedic device mechanical complication, initial encounter ([place]) 04/06/2016  . Status post right partial knee replacement 12/17/2015  . History of prostate cancer 10/13/2015  . BPH with obstruction/lower urinary tract symptoms 10/13/2015  . H/O nutritional disorder 03/31/2015  . Degeneration of intervertebral disc of lumbar region 11/14/2014  . Neuritis or radiculitis due to rupture of lumbar intervertebral disc 06/09/2014  . Lumbar canal stenosis 06/09/2014  . LBP (low back pain) 01/24/2014  . Acid reflux 11/08/2013  Dislocated medial insert status post UKA, right knee.  Past Medical History:  Diagnosis Date  . Arthritis   . BPH with obstruction/lower urinary tract symptoms   . Cancer ([place])   . Cataract   . DDD (degenerative disc disease), lumbar   . DJD (degenerative joint disease)   . GERD (gastroesophageal reflux disease)   . Heartburn   . HH (hiatus hernia)   . Hypogonadism in male   . Leg cramps   . Prostate cancer ([place])   . Sleep apnea    patient denies     Transfusion: None   Consultants (if any):   Discharged Condition: Improved  Hospital Course: Alexander Woods is an 70 y.o. male who was admitted 04/06/2016 with a diagnosis of a dislocated medial insert status post UKA of the right knee and went to the operating room on 04/06/2016 - 04/07/2016 and underwent the above named procedures.    Surgeries: Procedure(s): UNICOMPARTMENTAL KNEE/ REVISION POLYETHYLENE on 04/06/2016 - 04/07/2016 Patient tolerated the surgery well. Taken to PACU where she was  stabilized and then transferred to the orthopedic floor.  Foot pumps applied bilaterally at 80 mm. Heels elevated on bed with rolled towels. No evidence of DVT. Negative [place]. Physical therapy started on day #1 for gait training and transfer.  Patient's IV was d/c on POD1  Implants: A 7 mm meniscal bearing insert.  He was given perioperative antibiotics:  Anti-infectives    Start     Dose/Rate Route Frequency Ordered Stop   04/07/16 2100  ceFAZolin (ANCEF) IVPB 2g/100 mL premix     2 g 200 mL/hr over 30 Minutes Intravenous Every 6 hours 04/07/16 1811 04/08/16 1459   04/07/16 1400  ceFAZolin (ANCEF) IVPB 2g/100 mL premix     2 g 200 mL/hr over 30 Minutes Intravenous  Once 04/06/16 1744 04/07/16 1528   04/07/16 1335  ceFAZolin (ANCEF) 2-4 GM/100ML-% IVPB    Comments:  Phillips Grout: cabinet override      04/07/16 1335 04/07/16 1518    .  He was given sequential compression devices, early ambulation for DVT prophylaxis.  He benefited maximally from the hospital stay and there were no complications.    Recent vital signs:  Vitals:   04/08/16 0430 04/08/16 0720  BP: 110/71 130/81  Pulse: (!) 51 60  Resp: 16 18  Temp: 98.4 F (36.9 C) 98.7 F (37.1 C)    Recent laboratory studies:  Lab Results  Component Value Date   HGB 13.5 04/06/2016   HGB 13.8 12/18/2015   Lab Results  Component Value Date   WBC 6.9 04/06/2016   PLT 195 04/06/2016   Lab Results  Component Value Date  INR 0.98 04/06/2016   Lab Results  Component Value Date   NA 140 04/06/2016   K 4.1 04/06/2016   CL 110 04/06/2016   CO2 25 04/06/2016   BUN 17 04/06/2016   CREATININE 1.02 04/06/2016   GLUCOSE 135 (H) 04/06/2016    Discharge Medications:     Medication List    TAKE these medications   aspirin 81 MG tablet Take 81 mg by mouth daily.   calcium-vitamin D 500-200 MG-UNIT tablet Commonly known as:  OSCAL WITH D Take 1 tablet by mouth daily with breakfast. Reported on 10/13/2015    EPINEPHrine 0.3 mg/0.3 mL Soaj injection Commonly known as:  EPI-PEN Inject 0.3 mg into the muscle once.   Magnesium 250 MG Tabs Take 1 tablet by mouth daily.   MULTI-VITAMINS Tabs Take 1 tablet by mouth daily.   OVER THE COUNTER MEDICATION Take 1 tablet by mouth daily. Cinsulin   oxyCODONE 5 MG immediate release tablet Commonly known as:  Oxy IR/ROXICODONE Take 1-2 tablets (5-10 mg total) by mouth every 4 (four) hours as needed for breakthrough pain.   pantoprazole 40 MG tablet Commonly known as:  PROTONIX Take 40 mg by mouth daily as needed. Reported on 10/13/2015   Potassium Gluconate 595 MG Caps Take 1 capsule by mouth daily.   pyridoxine 100 MG tablet Commonly known as:  B-6 Take 100 mg by mouth daily.   VITAMIN B-12 PO Take 1,000 mcg by mouth daily.   Vitamin D3 2000 units capsule Take 2,000 Units by mouth daily.   ZANAFLEX 4 MG capsule Generic drug:  tiZANidine Take 4 mg by mouth at bedtime.       Diagnostic Studies: Dg Chest Portable 1 View  Result Date: 04/06/2016 CLINICAL DATA:  Pre-op respiratory exam. Prostate cancer. Gastroesophageal reflux disease and hiatal hernia. EXAM: PORTABLE CHEST 1 VIEW COMPARISON:  12/09/2015 FINDINGS: The heart size and mediastinal contours are within normal limits. Both lungs are clear. No evidence of pleural effusion or pneumothorax. Small hiatal hernia again noted. IMPRESSION: No active cardiopulmonary disease.  Small hiatal hernia. Electronically Signed   By: Alexander Woods M.D.   On: 04/06/2016 16:41   Dg Knee Complete 4 Views Right  Result Date: 04/06/2016 CLINICAL DATA:  Stepped in hole, twisted right knee EXAM: RIGHT KNEE - COMPLETE 4+ VIEW COMPARISON:  12/17/2015 FINDINGS: The patient is status post right knee hemi arthroplasty. There is abnormal appearance of the arthroplasty. The previously noted partially radiolucent joint space device is now all extruded out of the disc space scan demonstrates oblique orientation along  the medial aspect of the joint. There is an associated 9 mm fracture fragment adjacent to the medial femoral condyle inferiorly. There is metal on metal appearance of the medial prostatic not evident. Lateral view demonstrates a large joint effusion. There is soft tissue calcification or additional small bone fragment anterior to the proximal tibia. IMPRESSION: 1. Abnormal appearance of the left knee medial hemiarthroplasty with extrusion of previously noted partially radiolucent joint space device, now seen obliquely oriented along the medial joint margin. There is an associated fracture fragment, suspected to be from the distal, medial articular surface of the femur. There is metal on metal appearance of the prosthesis. 2. Large probable hemarthrosis in the suprapatellar compartment. Electronically Signed   By: Donavan Foil M.D.   On: 04/06/2016 15:40   Dg Knee Right Port  Result Date: 04/07/2016 CLINICAL DATA:  S/p right partial knee replacement; evaluate hardware placement EXAM: PORTABLE RIGHT KNEE -  1-2 VIEW COMPARISON:  04/06/2016 FINDINGS: Surgical clips overlie the knee. Right knee hemi arthroplasty again noted. There has been interval reduction of the radiolucent component previously noted along the medial aspect of the joint space. The alignment appears similar to the immediate postop study performed 12/17/2015. A small bone fragment projects along the medial aspect of the intercondylar notch on the frontal view, not well seen on the lateral view. Joint effusion is identified following surgery. IMPRESSION: Interval reduction of hemi arthroplasty. Small bone fragment projects over the medial aspect of the intercondylar notch. Electronically Signed   By: Nolon Nations M.D.   On: 04/07/2016 17:50    Disposition: Plan will be to discharge patient home today following therapy.    Follow-up Information    Judson Roch, PA-C Follow up in 10 day(s).   Specialty:  Physician Assistant Why:   Electa Sniff information: [place] [id number] [phone]            Signed: Judson Roch PA-C 04/08/2016, 8:08 AM

## 2016-04-08 NOTE — Progress Notes (Addendum)
  Subjective: 1 Day Post-Op Procedure(s) (LRB): UNICOMPARTMENTAL KNEE/ REVISION POLYETHYLENE (Left) Patient reports pain as mild.   Patient is well, and has had no acute complaints or problems Plan is to go Home after hospital stay. Negative for chest pain and shortness of breath Fever: no Gastrointestinal:Negative for nausea and vomiting  Objective: Vital signs in last 24 hours: Temp:  [96.4 F (35.8 C)-98.7 F (37.1 C)] 98.7 F (37.1 C) (11/03 0720) Pulse Rate:  [49-73] 60 (11/03 0720) Resp:  [14-147] 18 (11/03 0720) BP: (103-176)/(55-128) 130/81 (11/03 0720) SpO2:  [82 %-100 %] 99 % (11/03 0720) Weight:  [88.5 kg (195 lb)] 88.5 kg (195 lb) (11/02 1315)  Intake/Output from previous day:  Intake/Output Summary (Last 24 hours) at 04/08/16 0802 Last data filed at 04/08/16 0535  Gross per 24 hour  Intake             2105 ml  Output             1540 ml  Net              565 ml    Intake/Output this shift: No intake/output data recorded.  Labs:  Recent Labs  04/06/16 1549  HGB 13.5    Recent Labs  04/06/16 1549  WBC 6.9  RBC 4.93  HCT 40.4  PLT 195    Recent Labs  04/06/16 1549  NA 140  K 4.1  CL 110  CO2 25  BUN 17  CREATININE 1.02  GLUCOSE 135*  CALCIUM 8.7*    Recent Labs  04/06/16 1549  INR 0.98     EXAM General - Patient is Alert, Appropriate and Disorganized Extremity - ABD soft Sensation intact distally Intact pulses distally Dorsiflexion/Plantar flexion intact Incision: scant drainage Dressing/Incision - blood tinged drainage, moderate knee effusion present. Motor Function - intact, moving foot and toes well on exam.  Abdomen soft with normal BS.  Past Medical History:  Diagnosis Date  . Arthritis   . BPH with obstruction/lower urinary tract symptoms   . Cancer ([place])   . Cataract   . DDD (degenerative disc disease), lumbar   . DJD (degenerative joint disease)   . GERD (gastroesophageal reflux disease)   . Heartburn   .  HH (hiatus hernia)   . Hypogonadism in male   . Leg cramps   . Prostate cancer ([place])   . Sleep apnea    patient denies    Assessment/Plan: 1 Day Post-Op Procedure(s) (LRB): UNICOMPARTMENTAL KNEE/ REVISION POLYETHYLENE (Left) Active Problems:   Internal orthopedic device mechanical complication, initial encounter ([place])  Estimated body mass index is 27.2 kg/m as calculated from the following:   Height as of this encounter: 5\' 11"  (1.803 m).   Weight as of this encounter: 88.5 kg (195 lb). Advance diet Up with therapy Discharge home with home health   Labs reviewed this AM. Work with therapy today. Plan to discharge home today.  DVT Prophylaxis - Foot Pumps and TED hose Weight-Bearing as tolerated to right leg  J. Cameron Proud, PA-C [facility] Orthopaedic Surgery 04/08/2016, 8:02 AM

## 2016-04-08 NOTE — Progress Notes (Signed)
Shift assessment completed at 0745.pt in bed at that time, cpm in place, pt stated he used this all night. Pt is alert and oriented, on room air, lungs clear bilat, S1S2 heard. Abdomen is soft, bs heard. Pt voided 4106mls clear yellow urine in urinal,ppp. R knee has resolving edema, honeycomb dressing in place, cap refill to toes is wnl, feet are warm. PIV #20 intact to lac with d5ns with 20 meq kcl; infusing at 175mls/hr,site is free of redness and swelling. Since assessment completed, physical therapy has evaluated pt, and doctors have rounded. Pt is dc'd at this time via wc to front entrance of the facility. Prior to d/c, this writer changed dressing to r knee, all staples were intact with a scant amount of blood oozing from in between them. PIV removed by student nurse with this writer's supervision. D/C instructions were reviewed with pt and his wife, who verbally indicated they understood all material. Pt received script for oxycodone as well.

## 2016-04-08 NOTE — Evaluation (Signed)
Physical Therapy Evaluation Patient Details Name: Alexander Woods MRN: [id number] DOB: 1945-09-17 Today's Date: 04/08/2016   History of Present Illness  Pt is a 70 y.o. male who was doing yard work and stepped in a hole and twisted R knee with acute R knee pain.  Pt found to have dislocated polyethylene component.  Pt s/p UKA July 2017.  Pt now s/p 11/2 revision R unicondylar knee arthroplasty with polyethylene exchange.  PMH includes small hiatal hernia, UKA July 2017, prostate CA, LBP, plantar fasciitis.  Clinical Impression  Prior to admission, pt was independent.  Pt lives with his wife on main floor of home with stairs to enter.  Currently pt is modified independent with supine to sit, SBA with transfers, SBA with ambulation with RW, and CGA navigating stairs (see below for details).  Pt would benefit from skilled PT to address noted impairments and functional limitations.  Pt appears safe to discharge to home with support of family, use of RW, and OP PT when medically appropriate.    Follow Up Recommendations Outpatient PT    Equipment Recommendations   (pt already owns RW)    Recommendations for Other Services       Precautions / Restrictions Precautions Precautions: Fall;Knee Precaution Booklet Issued: Yes (comment) Restrictions Weight Bearing Restrictions: Yes RLE Weight Bearing: Weight bearing as tolerated      Mobility  Bed Mobility Overal bed mobility: Modified Independent Bed Mobility: Supine to Sit     Supine to sit: Modified independent (Device/Increase time)     General bed mobility comments: Mild increased time to perform but no assist required.  Transfers Overall transfer level: Needs assistance Equipment used: Rolling walker (2 wheeled) Transfers: Sit to/from Stand Sit to Stand: Supervision         General transfer comment: steady strong stand; no vc's required   Ambulation/Gait Ambulation/Gait assistance: Supervision Ambulation Distance  (Feet):  (80; 160 feet) Assistive device: Rolling walker (2 wheeled) Gait Pattern/deviations: Step-through pattern;Decreased stance time - right Gait velocity: mildly decreased   General Gait Details: occasional vc's to increase R knee flexion during swing phase and for R heelstrike; steady without loss of balance  Stairs Stairs: Yes Stairs assistance: Min guard Stair Management: Step to pattern;Forwards;With walker Number of Stairs: 4 General stair comments: pt initially requiring vc's for sequencing of LE's and then no further cueing required  Wheelchair Mobility    Modified Rankin (Stroke Patients Only)       Balance Overall balance assessment: Modified Independent Sitting-balance support: No upper extremity supported;Feet supported Sitting balance-Leahy Scale: Normal     Standing balance support: No upper extremity supported Standing balance-Leahy Scale: Good Standing balance comment: standing adjusting sheets on bed steady without loss of balance                             Pertinent Vitals/Pain Pain Assessment: 0-10 Pain Score: 4  (3/10 at rest; 4/10 with activity) Pain Location: R knee Pain Descriptors / Indicators: Discomfort Pain Intervention(s): Limited activity within patient's tolerance;Monitored during session;Premedicated before session;Repositioned;Ice applied  Vitals stable and WFL throughout treatment session.    Home Living Family/patient expects to be discharged to:: Private residence Living Arrangements: Spouse/significant other Available Help at Discharge: Family Type of Home: House Home Access: Stairs to enter Entrance Stairs-Rails: None Entrance Stairs-Number of Steps: 3 Home Layout: Able to live on main level with bedroom/bathroom;Multi-level (office on 2nd floor but able to work from downstairs initially) Home  Equipment: Gilford Rile - 2 wheels;Cane - single point;Crutches      Prior Function Level of Independence: Independent          Comments: Pt was walking 3 miles without AD and very active.  Pt denies any recent falls.     Hand Dominance        Extremity/Trunk Assessment   Upper Extremity Assessment: Overall WFL for tasks assessed           Lower Extremity Assessment: RLE deficits/detail (L LE WFL) RLE Deficits / Details: Able to perform R LE SLR independently x10 reps; R hip flexion at least 4/5; R knee flexion/extension at least 3/5; R DF at least 4/5    Cervical / Trunk Assessment: Normal  Communication   Communication: No difficulties  Cognition Arousal/Alertness: Awake/alert Behavior During Therapy: WFL for tasks assessed/performed Overall Cognitive Status: Within Functional Limits for tasks assessed                      General Comments General comments (skin integrity, edema, etc.): Pt laying in bed with CPM donned (0-60 degrees) upon PT entry.  Nursing cleared pt for participation in physical therapy.  Pt agreeable to PT session.     Exercises Total Joint Exercises Ankle Circles/Pumps: AROM;Strengthening;Both;10 reps;Supine Quad Sets: AROM;Strengthening;Right;10 reps;Supine Short Arc Quad: AROM;Strengthening;Right;10 reps;Supine Heel Slides: AAROM;Strengthening;Right;10 reps;Supine Hip ABduction/ADduction: AROM;Strengthening;Both;10 reps;Supine Straight Leg Raises: AROM;Strengthening;Both;10 reps;Supine Goniometric ROM: R knee extension 8 degrees short of neutral semi-supine in bed; R knee flexion 90 degrees in sitting AROM   Assessment/Plan    PT Assessment Patient needs continued PT services  PT Problem List Decreased strength;Decreased range of motion;Pain          PT Treatment Interventions DME instruction;Gait training;Stair training;Functional mobility training;Therapeutic activities;Therapeutic exercise;Balance training;Patient/family education    PT Goals (Current goals can be found in the Care Plan section)  Acute Rehab PT Goals Patient Stated Goal: to go  home PT Goal Formulation: With patient Time For Goal Achievement: 04/22/16 Potential to Achieve Goals: Good    Frequency BID   Barriers to discharge        Co-evaluation               End of Session Equipment Utilized During Treatment: Gait belt Activity Tolerance: Patient tolerated treatment well Patient left: in chair;with call bell/phone within reach;with chair alarm set;with SCD's reapplied (B heels elevated via towel rolls) Nurse Communication: Mobility status;Precautions;Weight bearing status         Time: [id number] PT Time Calculation (min) (ACUTE ONLY): 33 min   Charges:   PT Evaluation $PT Eval Low Complexity: 1 Procedure PT Treatments $Therapeutic Exercise: 8-22 mins   PT G CodesLeitha Bleak 04/14/2016, 10:02 AM Leitha Bleak, [place]

## 2016-04-08 NOTE — Care Management Important Message (Signed)
Important Message  Patient Details  Name: Dewaine Hoeppner MRN: [id number] Date of Birth: 01-May-1946   Medicare Important Message Given:  Yes    Jolly Mango, RN 04/08/2016, 9:20 AM

## 2016-04-08 NOTE — Discharge Instructions (Signed)

## 2016-04-08 NOTE — Plan of Care (Signed)
Problem: Bowel/Gastric: Goal: Will not experience complications related to bowel motility Outcome: Completed/Met Date Met: 04/08/16 Pt has met all goals for discharge.   

## 2016-04-11 ENCOUNTER — Encounter: Payer: Self-pay | Admitting: Surgery

## 2016-04-11 ENCOUNTER — Ambulatory Visit: Payer: Medicare Other | Admitting: Podiatry

## 2016-04-11 NOTE — Anesthesia Postprocedure Evaluation (Signed)
Anesthesia Post Note  Patient: Alexander Woods  Procedure(s) Performed: Procedure(s) (LRB): UNICOMPARTMENTAL KNEE/ REVISION POLYETHYLENE (Left)  Patient location during evaluation: PACU Anesthesia Type: General Level of consciousness: awake and alert Pain management: pain level controlled Vital Signs Assessment: post-procedure vital signs reviewed and stable Respiratory status: spontaneous breathing, nonlabored ventilation, respiratory function stable and patient connected to nasal cannula oxygen Cardiovascular status: blood pressure returned to baseline and stable Postop Assessment: no signs of nausea or vomiting Anesthetic complications: no    Last Vitals:  Vitals:   04/08/16 0430 04/08/16 0720  BP: 110/71 130/81  Pulse: (!) 51 60  Resp: 16 18  Temp: 36.9 C 37.1 C    Last Pain:  Vitals:   04/08/16 0725  TempSrc:   PainSc: Julian

## 2016-04-12 LAB — SURGICAL PATHOLOGY

## 2016-06-04 ENCOUNTER — Encounter: Payer: Self-pay | Admitting: Emergency Medicine

## 2016-06-04 ENCOUNTER — Emergency Department: Payer: Medicare Other

## 2016-06-04 ENCOUNTER — Inpatient Hospital Stay
Admission: EM | Admit: 2016-06-04 | Discharge: 2016-06-10 | DRG: 468 | Disposition: A | Discharge status: Home Health | Payer: Medicare Other | Attending: Surgery | Admitting: Surgery

## 2016-06-04 DIAGNOSIS — Z883 Allergy status to other anti-infective agents status: Secondary | ICD-10-CM | POA: Diagnosis not present

## 2016-06-04 DIAGNOSIS — Z96653 Presence of artificial knee joint, bilateral: Secondary | ICD-10-CM | POA: Diagnosis present

## 2016-06-04 DIAGNOSIS — R0981 Nasal congestion: Secondary | ICD-10-CM | POA: Diagnosis present

## 2016-06-04 DIAGNOSIS — Z96651 Presence of right artificial knee joint: Secondary | ICD-10-CM

## 2016-06-04 DIAGNOSIS — Z91030 Bee allergy status: Secondary | ICD-10-CM

## 2016-06-04 DIAGNOSIS — R112 Nausea with vomiting, unspecified: Secondary | ICD-10-CM | POA: Diagnosis not present

## 2016-06-04 DIAGNOSIS — E291 Testicular hypofunction: Secondary | ICD-10-CM | POA: Diagnosis present

## 2016-06-04 DIAGNOSIS — H269 Unspecified cataract: Secondary | ICD-10-CM | POA: Diagnosis present

## 2016-06-04 DIAGNOSIS — R262 Difficulty in walking, not elsewhere classified: Secondary | ICD-10-CM

## 2016-06-04 DIAGNOSIS — N401 Enlarged prostate with lower urinary tract symptoms: Secondary | ICD-10-CM | POA: Diagnosis present

## 2016-06-04 DIAGNOSIS — M48061 Spinal stenosis, lumbar region without neurogenic claudication: Secondary | ICD-10-CM | POA: Diagnosis present

## 2016-06-04 DIAGNOSIS — Z8249 Family history of ischemic heart disease and other diseases of the circulatory system: Secondary | ICD-10-CM

## 2016-06-04 DIAGNOSIS — Z79899 Other long term (current) drug therapy: Secondary | ICD-10-CM

## 2016-06-04 DIAGNOSIS — T84012A Broken internal right knee prosthesis, initial encounter: Secondary | ICD-10-CM | POA: Diagnosis not present

## 2016-06-04 DIAGNOSIS — Z7982 Long term (current) use of aspirin: Secondary | ICD-10-CM

## 2016-06-04 DIAGNOSIS — Z471 Aftercare following joint replacement surgery: Secondary | ICD-10-CM | POA: Diagnosis not present

## 2016-06-04 DIAGNOSIS — K219 Gastro-esophageal reflux disease without esophagitis: Secondary | ICD-10-CM | POA: Diagnosis present

## 2016-06-04 DIAGNOSIS — M25561 Pain in right knee: Secondary | ICD-10-CM | POA: Diagnosis present

## 2016-06-04 DIAGNOSIS — T84092A Other mechanical complication of internal right knee prosthesis, initial encounter: Secondary | ICD-10-CM | POA: Diagnosis not present

## 2016-06-04 DIAGNOSIS — Z888 Allergy status to other drugs, medicaments and biological substances status: Secondary | ICD-10-CM | POA: Diagnosis not present

## 2016-06-04 DIAGNOSIS — R509 Fever, unspecified: Secondary | ICD-10-CM

## 2016-06-04 DIAGNOSIS — W07XXXA Fall from chair, initial encounter: Secondary | ICD-10-CM | POA: Diagnosis present

## 2016-06-04 DIAGNOSIS — M9711XA Periprosthetic fracture around internal prosthetic right knee joint, initial encounter: Secondary | ICD-10-CM | POA: Diagnosis present

## 2016-06-04 DIAGNOSIS — Z8546 Personal history of malignant neoplasm of prostate: Secondary | ICD-10-CM | POA: Diagnosis not present

## 2016-06-04 DIAGNOSIS — Z881 Allergy status to other antibiotic agents status: Secondary | ICD-10-CM | POA: Diagnosis not present

## 2016-06-04 DIAGNOSIS — G4730 Sleep apnea, unspecified: Secondary | ICD-10-CM | POA: Diagnosis present

## 2016-06-04 DIAGNOSIS — R2681 Unsteadiness on feet: Secondary | ICD-10-CM

## 2016-06-04 DIAGNOSIS — Z79891 Long term (current) use of opiate analgesic: Secondary | ICD-10-CM | POA: Diagnosis not present

## 2016-06-04 LAB — COMPREHENSIVE METABOLIC PANEL
ALK PHOS: 65 U/L (ref 38–126)
ALT: 18 U/L (ref 17–63)
ANION GAP: 7 (ref 5–15)
AST: 23 U/L (ref 15–41)
Albumin: 4.3 g/dL (ref 3.5–5.0)
BUN: 18 mg/dL (ref 6–20)
CALCIUM: 9.4 mg/dL (ref 8.9–10.3)
CHLORIDE: 105 mmol/L (ref 101–111)
CO2: 25 mmol/L (ref 22–32)
Creatinine, Ser: 0.83 mg/dL (ref 0.61–1.24)
Glucose, Bld: 97 mg/dL (ref 65–99)
Potassium: 4.3 mmol/L (ref 3.5–5.1)
SODIUM: 137 mmol/L (ref 135–145)
Total Bilirubin: 0.4 mg/dL (ref 0.3–1.2)
Total Protein: 7 g/dL (ref 6.5–8.1)

## 2016-06-04 LAB — SURGICAL PCR SCREEN
MRSA, PCR: NEGATIVE
STAPHYLOCOCCUS AUREUS: NEGATIVE

## 2016-06-04 LAB — CBC
HCT: 41.8 % (ref 40.0–52.0)
Hemoglobin: 13.7 g/dL (ref 13.0–18.0)
MCH: 25.8 pg — AB (ref 26.0–34.0)
MCHC: 32.7 g/dL (ref 32.0–36.0)
MCV: 78.7 fL — ABNORMAL LOW (ref 80.0–100.0)
PLATELETS: 203 10*3/uL (ref 150–440)
RBC: 5.32 MIL/uL (ref 4.40–5.90)
RDW: 14.8 % — ABNORMAL HIGH (ref 11.5–14.5)
WBC: 6.6 10*3/uL (ref 3.8–10.6)

## 2016-06-04 MED ORDER — VITAMIN D 1000 UNITS PO TABS
2000.0000 [IU] | ORAL_TABLET | Freq: Every day | ORAL | Status: DC
  Administered 2016-06-05 – 2016-06-10 (×5): 2000 [IU] via ORAL
  Filled 2016-06-04 (×5): qty 2

## 2016-06-04 MED ORDER — ONDANSETRON HCL 4 MG PO TABS: 4.0000 mg | ORAL_TABLET | Freq: Four times a day (QID) | ORAL | Status: DC | PRN

## 2016-06-04 MED ORDER — CALCIUM CARBONATE-VITAMIN D 500-200 MG-UNIT PO TABS
1.0000 | ORAL_TABLET | Freq: Every day | ORAL | Status: DC
  Administered 2016-06-05 – 2016-06-10 (×5): 1 via ORAL
  Filled 2016-06-04 (×5): qty 1

## 2016-06-04 MED ORDER — ADULT MULTIVITAMIN W/MINERALS CH
1.0000 | ORAL_TABLET | Freq: Every day | ORAL | Status: DC
  Administered 2016-06-05 – 2016-06-10 (×5): 1 via ORAL
  Filled 2016-06-04 (×5): qty 1

## 2016-06-04 MED ORDER — VITAMIN B-6 50 MG PO TABS
100.0000 mg | ORAL_TABLET | Freq: Every day | ORAL | Status: DC
  Administered 2016-06-05 – 2016-06-10 (×5): 100 mg via ORAL
  Filled 2016-06-04 (×6): qty 2

## 2016-06-04 MED ORDER — OXYCODONE HCL 5 MG PO TABS: 5.0000 mg | ORAL_TABLET | ORAL | Status: DC | PRN

## 2016-06-04 MED ORDER — PANTOPRAZOLE SODIUM 40 MG PO TBEC
40.0000 mg | DELAYED_RELEASE_TABLET | Freq: Every day | ORAL | Status: DC
  Administered 2016-06-05 – 2016-06-10 (×5): 40 mg via ORAL
  Filled 2016-06-04 (×5): qty 1

## 2016-06-04 MED ORDER — DIPHENHYDRAMINE HCL 12.5 MG/5ML PO ELIX: 12.5000 mg | ORAL_SOLUTION | ORAL | Status: DC | PRN

## 2016-06-04 MED ORDER — MAGNESIUM 250 MG PO TABS: 1.0000 | ORAL_TABLET | Freq: Every day | ORAL | Status: DC

## 2016-06-04 MED ORDER — POTASSIUM GLUCONATE 595 MG PO CAPS: 1.0000 | ORAL_CAPSULE | Freq: Every day | ORAL | Status: DC

## 2016-06-04 MED ORDER — MAGNESIUM OXIDE 400 (241.3 MG) MG PO TABS
200.0000 mg | ORAL_TABLET | Freq: Every day | ORAL | Status: DC
  Administered 2016-06-05 – 2016-06-10 (×5): 200 mg via ORAL
  Filled 2016-06-04: qty 1
  Filled 2016-06-04: qty 0.5
  Filled 2016-06-04 (×3): qty 1

## 2016-06-04 MED ORDER — ACETAMINOPHEN 650 MG RE SUPP: 650.0000 mg | Freq: Four times a day (QID) | RECTAL | Status: DC | PRN

## 2016-06-04 MED ORDER — ONDANSETRON HCL 4 MG/2ML IJ SOLN: 4.0000 mg | Freq: Four times a day (QID) | INTRAMUSCULAR | Status: DC | PRN

## 2016-06-04 MED ORDER — TIZANIDINE HCL 4 MG PO TABS
4.0000 mg | ORAL_TABLET | Freq: Every day | ORAL | Status: DC
  Administered 2016-06-06 – 2016-06-10 (×3): 4 mg via ORAL
  Filled 2016-06-04 (×3): qty 1

## 2016-06-04 MED ORDER — ACETAMINOPHEN 325 MG PO TABS: 650.0000 mg | ORAL_TABLET | Freq: Four times a day (QID) | ORAL | Status: DC | PRN

## 2016-06-04 MED ORDER — ASPIRIN EC 325 MG PO TBEC
325.0000 mg | DELAYED_RELEASE_TABLET | Freq: Two times a day (BID) | ORAL | Status: DC
  Administered 2016-06-04 – 2016-06-10 (×11): 325 mg via ORAL
  Filled 2016-06-04 (×11): qty 1

## 2016-06-04 MED ORDER — MORPHINE SULFATE (PF) 2 MG/ML IV SOLN: 2.0000 mg | INTRAVENOUS | Status: DC | PRN

## 2016-06-04 NOTE — ED Notes (Signed)
Patient transported to x-ray by [facility] tech

## 2016-06-04 NOTE — Progress Notes (Signed)
PHARMACIST - PHYSICIAN ORDER COMMUNICATION  CONCERNING: P&T Medication Policy on Herbal Medications  DESCRIPTION:  This patient's order for:  Potassium gluconate  has been noted.  This product(s) is classified as an "herbal" or natural product and is not a significant source of potassium, providing only ~2.6 mEq daily.   Due to a lack of definitive safety studies or FDA approval, nonstandard manufacturing practices, plus the potential risk of unknown drug-drug interactions while on inpatient medications, the Pharmacy and Therapeutics Committee does not permit the use of "herbal" or natural products of this type within [facility].   ACTION TAKEN: The pharmacy department is unable to verify this order at this time. Please reevaluate patient's clinical condition at discharge and address if the herbal or natural product(s) should be resumed at that time.

## 2016-06-04 NOTE — Progress Notes (Addendum)
New Admit  Arrival Method: From ED Mental Orientation: Alert and Oriented X 4 Assessment: Lungs clear, non-productive cough, S1 S2 auscultated and regular. Non-pitting edema at Right knee. Active ROM all extremities. Using urinal to void. Can wiggle toes bilaterally. Full sensation to both lower extremities. Skin: Dry feet, healed incision Right knee Iv: 20 G left wrist Pain: Denies Safety Measures: Bed alarm on, Pt asked to call for help if he needs to get up. Admission: Complete 1A Orientation: Complete Family: Called by patient  Pt is to have a RTK replacement on Tuesday 06/07/16

## 2016-06-04 NOTE — ED Provider Notes (Signed)
[facility] Emergency Department Provider Note ____________________________________________  Time seen: Approximately 4:38 PM  I have reviewed the triage vital signs and the nursing notes.   HISTORY  Chief Complaint No chief complaint on file.    HPI Alexander Woods is a 70 y.o. male who presents to the emergency department for evaluation of right knee pain.He states that today, he was in the garage just oral and pushed up and his knee popped out. He states that he called his orthopedist and was advised to rest, ice, and elevate but he is unable to bear any weight on his right leg. He has had 2 partial knee replacement surgeries with the most recent being November 1. At this time, he is unable to fully extend his right lower extremity below the knee without sharp, sudden, severe pain and is unable to bear weight on that right lower extremity at all due to pain. He denies pain in the hip or ankle.  Past Medical History:  Diagnosis Date  . Arthritis   . BPH with obstruction/lower urinary tract symptoms   . Cancer ([place])   . Cataract   . DDD (degenerative disc disease), lumbar   . DJD (degenerative joint disease)   . GERD (gastroesophageal reflux disease)   . Heartburn   . HH (hiatus hernia)   . Hypogonadism in male   . Leg cramps   . Prostate cancer ([place])   . Sleep apnea    patient denies    Patient Active Problem List   Diagnosis Date Noted  . Internal orthopedic device mechanical complication, initial encounter ([place]) 04/06/2016  . Status post right partial knee replacement 12/17/2015  . History of prostate cancer 10/13/2015  . BPH with obstruction/lower urinary tract symptoms 10/13/2015  . H/O nutritional disorder 03/31/2015  . Degeneration of intervertebral disc of lumbar region 11/14/2014  . Neuritis or radiculitis due to rupture of lumbar intervertebral disc 06/09/2014  . Lumbar canal stenosis 06/09/2014  . LBP (low back pain) 01/24/2014  .  Acid reflux 11/08/2013    Past Surgical History:  Procedure Laterality Date  . [place] STUDY N/A 04/15/2015   Procedure: [place] STUDY;  Surgeon: Josefine Class, MD;  Location: [facility] ENDOSCOPY;  Service: Endoscopy;  Laterality: N/A;  . APPENDECTOMY    . bLERETHEPLASTY    . ESOPHAGEAL MANOMETRY N/A 04/15/2015   Procedure: ESOPHAGEAL MANOMETRY (EM);  Surgeon: Josefine Class, MD;  Location: [facility] ENDOSCOPY;  Service: Endoscopy;  Laterality: N/A;  . ESOPHAGOGASTRODUODENOSCOPY (EGD) WITH PROPOFOL N/A 02/12/2015   Procedure: ESOPHAGOGASTRODUODENOSCOPY (EGD) WITH PROPOFOL;  Surgeon: Josefine Class, MD;  Location: [facility] ENDOSCOPY;  Service: Endoscopy;  Laterality: N/A;  . INCISION TENDON SHEATH FOR TRIGGER FINGER    . PARTIAL KNEE ARTHROPLASTY Right 12/17/2015   Procedure: UNICOMPARTMENTAL KNEE;  Surgeon: Corky Mull, MD;  Location: [facility] ORS;  Service: Orthopedics;  Laterality: Right;  . PARTIAL KNEE ARTHROPLASTY Left 04/07/2016   Procedure: UNICOMPARTMENTAL KNEE/ REVISION POLYETHYLENE;  Surgeon: Corky Mull, MD;  Location: [facility] ORS;  Service: Orthopedics;  Laterality: Left;  . PLANTAR FASCITIS     . PROSTATE SURGERY    . TONSILLECTOMY    . TURP VAPORIZATION      Prior to Admission medications   Medication Sig Start Date End Date Taking? Authorizing Provider  aspirin 81 MG tablet Take 81 mg by mouth daily.    Historical Provider, MD  calcium-vitamin D (OSCAL WITH D) 500-200 MG-UNIT per tablet Take 1 tablet by  mouth daily with breakfast. Reported on 10/13/2015    Historical Provider, MD  Cholecalciferol (VITAMIN D3) 2000 units capsule Take 2,000 Units by mouth daily.     Historical Provider, MD  Cyanocobalamin (VITAMIN B-12 PO) Take 1,000 mcg by mouth daily.    Historical Provider, MD  EPINEPHrine 0.3 mg/0.3 mL IJ SOAJ injection Inject 0.3 mg into the muscle once.    Historical Provider, MD  Magnesium 250 MG TABS Take 1 tablet by mouth daily.     Historical Provider, MD  Multiple  Vitamin (MULTI-VITAMINS) TABS Take 1 tablet by mouth daily.     Historical Provider, MD  OVER THE COUNTER MEDICATION Take 1 tablet by mouth daily. Cinsulin    Historical Provider, MD  oxyCODONE (OXY IR/ROXICODONE) 5 MG immediate release tablet Take 1-2 tablets (5-10 mg total) by mouth every 4 (four) hours as needed for breakthrough pain. 04/08/16   Lattie Corns, PA-C  pantoprazole (PROTONIX) 40 MG tablet Take 40 mg by mouth daily as needed. Reported on 10/13/2015 02/12/15   Historical Provider, MD  Potassium Gluconate 595 MG CAPS Take 1 capsule by mouth daily.    Historical Provider, MD  pyridoxine (B-6) 100 MG tablet Take 100 mg by mouth daily.    Historical Provider, MD  tiZANidine (ZANAFLEX) 4 MG capsule Take 4 mg by mouth at bedtime.     Historical Provider, MD    Allergies Bacitracin; Bee venom; Ciprofloxacin; Erythromycin ethylsuccinate; Monosodium glutamate; Fentanyl; and Enoxaparin  Family History  Problem Relation Age of Onset  . Stroke Father   . Heart disease Mother     Aortic Tear  . Heart failure Mother   . Kidney disease Neg Hx   . Prostate cancer Neg Hx     Social History Social History  Substance Use Topics  . Smoking status: Never Smoker  . Smokeless tobacco: Never Used  . Alcohol use No    Review of Systems Constitutional: No recent illness. Cardiovascular: Denies chest pain or palpitations. Respiratory: Denies shortness of breath. Musculoskeletal: Pain in Right knee Skin: Negative for rash, wound, lesion. Neurological: Negative for focal weakness or numbness.  ____________________________________________   PHYSICAL EXAM:  VITAL SIGNS: ED Triage Vitals  Enc Vitals Group     BP 06/04/16 1501 (!) 150/96     Pulse Rate 06/04/16 1501 95     Resp 06/04/16 1501 16     Temp 06/04/16 1501 98.7 F (37.1 C)     Temp Source 06/04/16 1501 Oral     SpO2 06/04/16 1501 94 %     Weight 06/04/16 1501 191 lb (86.6 kg)     Height 06/04/16 1501 5\' 11"  (1.803 m)      Head Circumference --      Peak Flow --      Pain Score 06/04/16 1459 0     Pain Loc --      Pain Edu? --      Excl. in [place]? --     Constitutional: Alert and oriented. Well appearing and in no acute distress. Eyes: Conjunctivae are normal. EOMI. Head: Atraumatic. Neck: No stridor.  Respiratory: Normal respiratory effort.   Musculoskeletal: Very limited extension of the right knee. Obvious deformity is noted. Neurologic:  Normal speech and language. No gross focal neurologic deficits are appreciated. Speech is normal. No gait instability. Skin:  Skin is warm, dry and intact. Atraumatic. Healing surgical wound over the anterior right knee without any evidence of cellulitis. Psychiatric: Mood and affect are normal. Speech  and behavior are normal.  ____________________________________________   LABS (all labs ordered are listed, but only abnormal results are displayed)  Labs Reviewed - No data to display ____________________________________________  RADIOLOGY  Anterior, lateral dislocation of the arthroplasty of the right knee per radiology. I, Sherrie George, personally viewed and evaluated these images (plain radiographs) as part of my medical decision making, as well as reviewing the written report by the radiologist.  ____________________________________________   PROCEDURES  Procedure(s) performed: None   ____________________________________________   INITIAL IMPRESSION / ASSESSMENT AND PLAN / ED COURSE  Clinical Course     Pertinent labs & imaging results that were available during my care of the patient were reviewed by me and considered in my medical decision making (see chart for details).  After discussing x-ray findings with the patient and his wife, the on-call orthopedist was contacted who then in turn contacted Dr. Roland Rack who is the patient's primary orthopedist and performed the previous 2 surgeries. After some discussion between the patient and Dr. Roland Rack,  it was decided that the patient is to be admitted under the orthopedic services and will undergo total knee replacement on Tuesday. While in the emergency department today, the patient declined pain medication. He was encouraged to report any increase in pain or changes in symptoms. ____________________________________________   FINAL CLINICAL IMPRESSION(S) / ED DIAGNOSES  Final diagnoses:  Periprosthetic fracture around internal prosthetic right knee joint, initial encounter       Victorino Dike, FNP 06/04/16 [place] Qu[place], MD 06/04/16 [phone]

## 2016-06-04 NOTE — ED Triage Notes (Signed)
Patient states that he had knee surgery in November. Patient states that today he was in the garage on a stool and push up and his knee "popped" out. Patient states that he called his ortho doctor and they told him to elevate and ice it but patient states that he is unable to bear weight on his right leg.

## 2016-06-05 MED ORDER — GUAIFENESIN 100 MG/5ML PO SOLN
5.0000 mL | Freq: Four times a day (QID) | ORAL | Status: DC | PRN
  Administered 2016-06-05 (×2): 100 mg via ORAL
  Filled 2016-06-05 (×3): qty 5

## 2016-06-05 MED ORDER — ENOXAPARIN SODIUM 40 MG/0.4ML ~~LOC~~ SOLN
40.0000 mg | SUBCUTANEOUS | Status: DC
  Administered 2016-06-05: 40 mg via SUBCUTANEOUS
  Filled 2016-06-05: qty 0.4

## 2016-06-05 NOTE — Care Management Note (Addendum)
Case Management Note  Patient Details  Name: Nazariy Dunkley MRN: [id number] Date of Birth: [birth year]  Subjective/Objective:    Discussed discharge planning with Mr Janow who requests to use [facility] again for home health services as he plans to return home after his right TKR on 06/08/15 by Dr Roland Rack. He has a RW, cane, [employer] and crutches from his 2 previous right knee surgeries by Dr Roland Rack. Pharmacy=[facility] in [place]. PCP=Dr Singh. Does not want a BSC. States that there are only 2 steps to get into his home and that he and his wife are now very experienced in caring for him at home. Case management will follow for discharge planning.                 Action/Plan:   Expected Discharge Date:  06/08/16               Expected Discharge Plan:     In-House Referral:     Discharge planning Services     Post Acute Care Choice:    Choice offered to:     DME Arranged:    DME Agency:     HH Arranged:    HH Agency:     Status of Service:     If discussed at [employer], dates discussed:    Additional Comments:  Logun Colavito A, RN 06/05/2016, 8:35 AM

## 2016-06-05 NOTE — H&P (Signed)
Subjective:  Chief complaint:  Right knee pain.  The patient is a 70 y.o. male who sustained an injury to the right knee.  The patient denies any associated injury or loss of consciousness associated with the injury, and denies any light-headedness, loss of consciousness, chest pain, or shortness of breath which might have contributed to the injury.  The patient is s/p a right partial knee replacement performed by Dr. Roland Rack on 12/17/15, in November of this year the patient stepped in a hole and suffered a twisted knee injury with resulted in a dislocation of the poly-insert in the right knee, he then underwent a poly-exchange on 04/06/16.  Pt reports that he was doing very well until yesterday afternoon when he was in his garage and was getting up from a stool when he felt a pop and immediate pain in the right knee.  He was unable to bear-weight or full-extend his knee.  He contacted myself, as I was on-call, I encouraged the patient to elevate and ice the knee however if he was completely unable to bear weight he was instructed to proceed to the ER.  In the ER x-rays demonstrated a recurrent dislocation of the right knee poly.  He has been admitted and the plan will be to undergo a revision of right knee to a total knee arthroplasty.  Pt denies any signs of infection, no numbness or tingling present to the right lower extremity.  Patient Active Problem List   Diagnosis Date Noted  . Right knee pain 06/04/2016  . Internal orthopedic device mechanical complication, initial encounter ([place]) 04/06/2016  . Status post right partial knee replacement 12/17/2015  . History of prostate cancer 10/13/2015  . BPH with obstruction/lower urinary tract symptoms 10/13/2015  . H/O nutritional disorder 03/31/2015  . Degeneration of intervertebral disc of lumbar region 11/14/2014  . Neuritis or radiculitis due to rupture of lumbar intervertebral disc 06/09/2014  . Lumbar canal stenosis 06/09/2014  . LBP (low back pain)  01/24/2014  . Acid reflux 11/08/2013   Past Medical History:  Diagnosis Date  . Arthritis   . BPH with obstruction/lower urinary tract symptoms   . Cancer ([place])   . Cataract   . DDD (degenerative disc disease), lumbar   . DJD (degenerative joint disease)   . GERD (gastroesophageal reflux disease)   . Heartburn   . HH (hiatus hernia)   . Hypogonadism in male   . Leg cramps   . Prostate cancer ([place])   . Sleep apnea    patient denies    Past Surgical History:  Procedure Laterality Date  . [place] STUDY N/A 04/15/2015   Procedure: [place] STUDY;  Surgeon: Josefine Class, MD;  Location: [facility] ENDOSCOPY;  Service: Endoscopy;  Laterality: N/A;  . APPENDECTOMY    . bLERETHEPLASTY    . ESOPHAGEAL MANOMETRY N/A 04/15/2015   Procedure: ESOPHAGEAL MANOMETRY (EM);  Surgeon: Josefine Class, MD;  Location: [facility] ENDOSCOPY;  Service: Endoscopy;  Laterality: N/A;  . ESOPHAGOGASTRODUODENOSCOPY (EGD) WITH PROPOFOL N/A 02/12/2015   Procedure: ESOPHAGOGASTRODUODENOSCOPY (EGD) WITH PROPOFOL;  Surgeon: Josefine Class, MD;  Location: [facility] ENDOSCOPY;  Service: Endoscopy;  Laterality: N/A;  . INCISION TENDON SHEATH FOR TRIGGER FINGER    . PARTIAL KNEE ARTHROPLASTY Right 12/17/2015   Procedure: UNICOMPARTMENTAL KNEE;  Surgeon: Corky Mull, MD;  Location: [facility] ORS;  Service: Orthopedics;  Laterality: Right;  . PARTIAL KNEE ARTHROPLASTY Left 04/07/2016   Procedure: UNICOMPARTMENTAL KNEE/ REVISION POLYETHYLENE;  Surgeon: Marshall Cork  Poggi, MD;  Location: [facility] ORS;  Service: Orthopedics;  Laterality: Left;  . PLANTAR FASCITIS     . PROSTATE SURGERY    . TONSILLECTOMY    . TURP VAPORIZATION      Prescriptions Prior to Admission  Medication Sig Dispense Refill Last Dose  . aspirin 81 MG tablet Take 81 mg by mouth daily.   06/04/2016 at 0800  . Cholecalciferol (VITAMIN D3) 2000 units capsule Take 2,000 Units by mouth daily.    06/04/2016 at 0800  . EPINEPHrine 0.3 mg/0.3 mL IJ SOAJ injection  Inject 0.3 mg into the muscle once.   prn at prn  . Magnesium 250 MG TABS Take 1 tablet by mouth daily.    06/04/2016 at 0800  . Multiple Vitamin (MULTI-VITAMINS) TABS Take 1 tablet by mouth daily.    06/04/2016 at 0800  . pantoprazole (PROTONIX) 40 MG tablet Take 40 mg by mouth daily as needed. Reported on 10/13/2015   06/04/2016 at 0800  . Potassium Gluconate 595 MG CAPS Take 1 capsule by mouth daily.   06/04/2016 at 0800  . pyridoxine (B-6) 100 MG tablet Take 100 mg by mouth daily.   06/04/2016 at 0800  . tiZANidine (ZANAFLEX) 4 MG capsule Take 4 mg by mouth at bedtime.    Past Week at prn  . calcium-vitamin D (OSCAL WITH D) 500-200 MG-UNIT per tablet Take 1 tablet by mouth daily with breakfast. Reported on 10/13/2015   Not Taking at 0800  . oxyCODONE (OXY IR/ROXICODONE) 5 MG immediate release tablet Take 1-2 tablets (5-10 mg total) by mouth every 4 (four) hours as needed for breakthrough pain. (Patient not taking: Reported on 06/04/2016) 60 tablet 0 Completed Course at Unknown time   Allergies  Allergen Reactions  . Bacitracin Anaphylaxis  . Bee Venom Anaphylaxis  . Ciprofloxacin Anaphylaxis  . Erythromycin Ethylsuccinate Anaphylaxis  . Monosodium Glutamate Anaphylaxis  . Fentanyl     Heart rate dropped to 30 beats per minute and caused convulsions.  . Enoxaparin Rash    Social History  Substance Use Topics  . Smoking status: Never Smoker  . Smokeless tobacco: Never Used  . Alcohol use No    Family History  Problem Relation Age of Onset  . Stroke Father   . Heart disease Mother     Aortic Tear  . Heart failure Mother   . Kidney disease Neg Hx   . Prostate cancer Neg Hx      Review of Systems: As noted above. The patient denies any chest pain, shortness of breath, nausea, vomiting, diarrhea, constipation, belly pain, blood in his/her stool, or burning with urination.  Objective: Temp:  [97.8 F (36.6 C)-98.7 F (37.1 C)] 98 F (36.7 C) (12/31 0749) Pulse Rate:  [68-95] 75  (12/31 0749) Resp:  [16-20] 18 (12/31 0749) BP: (141-170)/(79-101) 144/99 (12/31 0749) SpO2:  [94 %-100 %] 94 % (12/31 0749) Weight:  [86.6 kg (191 lb)-89 kg (196 lb 1.6 oz)] 89 kg (196 lb 1.6 oz) (12/30 2034)  Physical Exam: General:  Alert, no acute distress Psychiatric:  Patient is competent for consent with normal mood and affect Cardiovascular:  RRR  Respiratory:  Clear to auscultation. No wheezing. Non-labored breathing GI:  Abdomen is soft and non-tender Skin:  No lesions in the area of chief complaint Neurologic:  Sensation intact distally Lymphatic:  No axillary or cervical lymphadenopathy  Orthopedic Exam:  Skin inspection of the right knee demonstrates a well-healed surgical incision without evidence for infection, slight varus abnormality  to the right knee.  Moderate effusion present to the right knee this morning, no erythema or ecchymosis.  Pt is tender to palpation over the medial joint line with a palpable abnormality, no pain with palpation of the lateral joint lint.  Pt has his knee at 45 degrees of flexion, states he is unable to flex the knee more or extend without significant pain.  Some opening with valgus stress but a firm endpoint with moderate pain.  Pt is intact to light touch to the right lower extremity, able to flex and extend the right ankle and toes without discomfort, cap refill intact to each toe on today's assessment.  Imaging Review: Recent x-rays of the right knee are available for review.  These films demonstrate evidence for a repeat anterior and lateral displacement of the radiolucent spacer device for the partial hemiarthroplasty.  Assessment: Complication of orthopaedic device, dislocation of polyethylene spacer.  Plan: 1.  Treatment options were discussed today with the patient. 2.  Pt was admitted to [facility], plan will be for conversion of right partial knee arthroplasty to a total knee arthroplasty, to be performed by Dr. Roland Rack in 06/07/16. 3.  The  patient was instructed on the risks and benefits of the procedure and wishes to proceed, he will require up to 3 days of admission following his surgery.  Will start Lovenox today today for DVT prophylaxis. 4.  Pt's BP 144/99, likely due to pain, will continue to monitor while an inpatient.  The risks (including bleeding, infection, nerve and/or blood vessel injury, persistent or recurrent pain, loosening or failure of the components, leg length inequality, dislocation, need for further surgery, blood clots, strokes, heart attacks or arrhythmias, pneumonia, etc.) and benefits of a total hip arthroplasty were discussed. The patient states his/her understanding and agrees to proceed. He agrees to a blood transfusion if necessary. A consent will be signed at the time of surgery.  Raquel James, PA-C [place]

## 2016-06-05 NOTE — Progress Notes (Signed)
Pt requesting something for congestion. Cameron Proud paged and notified. Orders received for Robitussin 100 mg solution Q6 PRN. Order placed

## 2016-06-06 MED ORDER — FLUTICASONE PROPIONATE 50 MCG/ACT NA SUSP
1.0000 | Freq: Two times a day (BID) | NASAL | Status: AC
  Administered 2016-06-06 – 2016-06-07 (×3): 1 via NASAL
  Filled 2016-06-06: qty 16

## 2016-06-06 MED ORDER — CEFAZOLIN SODIUM-DEXTROSE 2-4 GM/100ML-% IV SOLN
2.0000 g | INTRAVENOUS | Status: DC
  Filled 2016-06-06: qty 100

## 2016-06-06 MED ORDER — ENOXAPARIN SODIUM 40 MG/0.4ML ~~LOC~~ SOLN
40.0000 mg | Freq: Once | SUBCUTANEOUS | Status: AC
  Administered 2016-06-06: 40 mg via SUBCUTANEOUS
  Filled 2016-06-06: qty 0.4

## 2016-06-06 NOTE — Clinical Social Work Note (Signed)
Clinical Social Work Assessment  Patient Details  Name: Alexander Woods MRN: [id number] Date of Birth: [birth year]  Date of referral:  06/06/16               Reason for consult:  Facility Placement                Permission sought to share information with:  Chartered certified accountant granted to share information::  Yes, Verbal Permission Granted  Name::      [place]::     Relationship::     Contact Information:     Housing/Transportation Living arrangements for the past 2 months:  [place] Agency of Information:  Patient, Spouse Patient Interpreter Needed:  None Criminal Activity/Legal Involvement Pertinent to Current Situation/Hospitalization:  No - Comment as needed Significant Relationships:  Adult Children, Spouse Lives with:  Spouse Do you feel safe going back to the place where you live?  Yes Need for family participation in patient care:  Yes (Comment)  Care giving concerns:  Patient lives in [place] with his wife Alexander Woods.    Social Worker assessment / plan:  Holiday representative (CSW) reviewed chart and noted that patient will have knee surgery tomorrow. Patient recently had knee surgery at [facility] in Nov. 2017. CSW met with patient and his wife Alexander Woods was at bedside. Patient was alert and oriented and was sitting up in the chair. CSW introduced self and explained role of CSW department. Per patient this will be his 3rd knee surgery and he plans on going home with [name] and requested PT Claiborne Billings. CSW explained that PT may recommend home health or SNF after surgery. Patient prefers to go home but is open to SNF if needed. CSW explained that Medicare will pay for SNF if a 3 night qualifying inpatient stay is obtained. Patient was admitted to inpatient on 06/04/16. Patient and wife verbalized their understanding. CSW will continue to follow and assist as needed.   Employment status:  Retired Radiation protection practitioner:  Medicare PT Recommendations:  Not assessed at this time [place] / Referral to community resources:  [place], Other (Comment Required) ([place]. SNF )  Patient/Family's Response to care:  Patient prefers to go home.   Patient/Family's Understanding of and Emotional Response to Diagnosis, Current Treatment, and Prognosis:  Patient and wife were very pleasant and thanked CSW for assistance.   Emotional Assessment Appearance:  Appears stated age Attitude/Demeanor/Rapport:    Affect (typically observed):  Accepting, Adaptable, Pleasant Orientation:  Oriented to Self, Oriented to Place, Oriented to  Time, Oriented to Situation Alcohol / Substance use:  Not Applicable Psych involvement (Current and /or in the community):  No (Comment)  Discharge Needs  Concerns to be addressed:  Discharge Planning Concerns Readmission within the last 30 days:  No Current discharge risk:  Dependent with Mobility Barriers to Discharge:  Continued Medical Work up   [employer], Veronia Beets, LCSW 06/06/2016, 3:16 PM

## 2016-06-06 NOTE — NC FL2 (Signed)
[place] LEVEL OF CARE SCREENING TOOL     IDENTIFICATION  Patient Name: Alexander Woods Birthdate: 03/27/1946 Sex: male Admission Date (Current Location): 06/04/2016  [place] and [place] Number:  Engineering geologist and Address:  [facility], [address] [place], [place] [address]      Provider Number: [id number]  Attending Physician Name and Address:  Corky Mull, MD  Relative Name and Phone Number:       Current Level of Care: Hospital Recommended Level of Care: [place] Prior Approval Number:    Date Approved/Denied:   PASRR Number:  ([id number])  Discharge Plan: SNF    Current Diagnoses: Patient Active Problem List   Diagnosis Date Noted  . Right knee pain 06/04/2016  . Internal orthopedic device mechanical complication, initial encounter ([place]) 04/06/2016  . Status post right partial knee replacement 12/17/2015  . History of prostate cancer 10/13/2015  . BPH with obstruction/lower urinary tract symptoms 10/13/2015  . H/O nutritional disorder 03/31/2015  . Degeneration of intervertebral disc of lumbar region 11/14/2014  . Neuritis or radiculitis due to rupture of lumbar intervertebral disc 06/09/2014  . Lumbar canal stenosis 06/09/2014  . LBP (low back pain) 01/24/2014  . Acid reflux 11/08/2013    Orientation RESPIRATION BLADDER Height & Weight     Self, Time, Situation, Place  Normal Continent Weight: 196 lb 1.6 oz (89 kg) Height:  5\' 11"  (180.3 cm)  BEHAVIORAL SYMPTOMS/MOOD NEUROLOGICAL BOWEL NUTRITION STATUS   (none)  (none) Continent Diet (Regular Diet )  AMBULATORY STATUS COMMUNICATION OF NEEDS Skin   Extensive Assist Verbally Surgical wounds                       Personal Care Assistance Level of Assistance  Bathing, Feeding, Dressing Bathing Assistance: Limited assistance Feeding assistance: Independent Dressing Assistance: Limited assistance     Functional Limitations Info   Sight, Hearing, Speech Sight Info: Adequate Hearing Info: Adequate Speech Info: Adequate    SPECIAL CARE FACTORS FREQUENCY  PT (By licensed PT), OT (By licensed OT)     PT Frequency:  (5) OT Frequency:  (5)            Contractures      Additional Factors Info  Code Status, Allergies Code Status Info:  (Full Code. ) Allergies Info:  (Bacitracin, Bee Venom, Ciprofloxacin, Erythromycin Ethylsuccinate,Monosodium Glutamate, Fentanyl, Enoxaparin)           Current Medications (06/06/2016):  This is the current hospital active medication list Current Facility-Administered Medications  Medication Dose Route Frequency Provider Last Rate Last Dose  . acetaminophen (TYLENOL) tablet 650 mg  650 mg Oral Q6H PRN Eugenie Filler, MD       Or  . acetaminophen (TYLENOL) suppository 650 mg  650 mg Rectal Q6H PRN Eugenie Filler, MD      . aspirin EC tablet 325 mg  325 mg Oral BID Eugenie Filler, MD   325 mg at 06/06/16 0911  . calcium-vitamin D (OSCAL WITH D) 500-200 MG-UNIT per tablet 1 tablet  1 tablet Oral Q breakfast Eugenie Filler, MD   1 tablet at 06/06/16 0910  . cholecalciferol (VITAMIN D) tablet 2,000 Units  2,000 Units Oral Daily Eugenie Filler, MD   2,000 Units at 06/06/16 0911  . diphenhydrAMINE (BENADRYL) 12.5 MG/5ML elixir 12.5-25 mg  12.5-25 mg Oral Q4H PRN Eugenie Filler, MD      . fluticasone (FLONASE) 50  MCG/ACT nasal spray 1 spray  1 spray Each Nare Q12H Duanne Guess, PA-C   1 spray at 06/06/16 1002  . guaiFENesin (ROBITUSSIN) 100 MG/5ML solution 100 mg  5 mL Oral Q6H PRN Lattie Corns, PA-C   100 mg at 06/05/16 1849  . magnesium oxide (MAG-OX) tablet 200 mg  200 mg Oral Daily Eugenie Filler, MD   200 mg at 06/06/16 0911  . morphine 2 MG/ML injection 2 mg  2 mg Intravenous Q2H PRN Eugenie Filler, MD      . multivitamin with minerals tablet 1 tablet  1 tablet Oral Daily Eugenie Filler, MD   1 tablet at 06/06/16 0910  . ondansetron (ZOFRAN) tablet 4 mg  4 mg Oral Q6H PRN Eugenie Filler, MD       Or  . ondansetron (ZOFRAN) injection 4 mg  4 mg Intravenous Q6H PRN Eugenie Filler, MD      . oxyCODONE (Oxy IR/ROXICODONE) immediate release tablet 5-10 mg  5-10 mg Oral Q3H PRN Eugenie Filler, MD      . pantoprazole (PROTONIX) EC tablet 40 mg  40 mg Oral Daily Eugenie Filler, MD   40 mg at 06/06/16 0911  . pyridOXINE (VITAMIN B-6) tablet 100 mg  100 mg Oral Daily Eugenie Filler, MD   100 mg at 06/06/16 1002  . tiZANidine (ZANAFLEX) tablet 4 mg  4 mg Oral QHS Eugenie Filler, MD         Discharge Medications: Please see discharge summary for a list of discharge medications.  Relevant Imaging Results:  Relevant Lab Results:   Additional Information  (SSN: [id number])  Thursa Emme, Veronia Beets, LCSW

## 2016-06-06 NOTE — Progress Notes (Signed)
   Subjective:   Patient reports pain in right knee as mild.   Patient is well, but has had some minor complaints of nasal congestion. Denies any coughing, fevers, CP, SOB, ABD pain. Plan is to undergo conversion of right partial knee replacement to total knee replacement tomorrow am.  Objective: Vital signs in last 24 hours: Temp:  [98.3 F (36.8 C)-98.9 F (37.2 C)] 98.3 F (36.8 C) (01/01 0729) Pulse Rate:  [66-83] 66 (01/01 0729) Resp:  [18-19] 18 (01/01 0729) BP: (126-154)/(85-91) 126/88 (01/01 0729) SpO2:  [96 %-100 %] 97 % (01/01 0729)  Intake/Output from previous day: 12/31 0701 - 01/01 0700 In: 720 [P.O.:720] Out: 650 [Urine:650] Intake/Output this shift: No intake/output data recorded.   Recent Labs  06/04/16 1944  HGB 13.7    Recent Labs  06/04/16 1944  WBC 6.6  RBC 5.32  HCT 41.8  PLT 203    Recent Labs  06/04/16 1944  NA 137  K 4.3  CL 105  CO2 25  BUN 18  CREATININE 0.83  GLUCOSE 97  CALCIUM 9.4   No results for input(s): LABPT, INR in the last 72 hours.  EXAM General - Patient is Alert, Appropriate and Oriented Extremity - Neurovascular intact Sensation intact distally Intact pulses distally Dorsiflexion/Plantar flexion intact No cellulitis present Compartment soft Motor Function - intact, moving foot and toes well on exam.   Past Medical History:  Diagnosis Date  . Arthritis   . BPH with obstruction/lower urinary tract symptoms   . Cancer ([place])   . Cataract   . DDD (degenerative disc disease), lumbar   . DJD (degenerative joint disease)   . GERD (gastroesophageal reflux disease)   . Heartburn   . HH (hiatus hernia)   . Hypogonadism in male   . Leg cramps   . Prostate cancer ([place])   . Sleep apnea    patient denies    Assessment/Plan:      Active Problems:   Right knee pain  Estimated body mass index is 27.35 kg/m as calculated from the following:   Height as of this encounter: 5\' 11"  (1.803 m).   Weight as of  this encounter: 89 kg (196 lb 1.6 oz).  Conversion of right partial knee to total knee replacement tomorrow am NPO after midnight Hold Lovenox tomorrow am Start flonase today for nasal congestion.  DVT Prophylaxis - Lovenox    T. Rachelle Hora, PA-C [place] 06/06/2016, 8:20 AM

## 2016-06-06 NOTE — Plan of Care (Signed)
Problem: Activity: Goal: Risk for activity intolerance will decrease Outcome: Not Progressing Remains NWB, for surgery tomorrow

## 2016-06-07 ENCOUNTER — Inpatient Hospital Stay: Payer: Medicare Other | Admitting: Anesthesiology

## 2016-06-07 ENCOUNTER — Encounter
Admission: EM | Disposition: A | Discharge status: Home Health | Payer: Self-pay | Source: Home / Self Care | Attending: Surgery

## 2016-06-07 ENCOUNTER — Encounter: Payer: Self-pay | Admitting: *Deleted

## 2016-06-07 ENCOUNTER — Inpatient Hospital Stay: Payer: Medicare Other

## 2016-06-07 HISTORY — PX: TOTAL KNEE REVISION: SHX996

## 2016-06-07 LAB — TYPE AND SCREEN
ABO/RH(D): A POS
ANTIBODY SCREEN: NEGATIVE

## 2016-06-07 SURGERY — TOTAL KNEE REVISION
Anesthesia: Spinal | Site: Knee | Laterality: Right | Wound class: Clean

## 2016-06-07 MED ORDER — FLEET ENEMA 7-19 GM/118ML RE ENEM: 1.0000 | ENEMA | Freq: Once | RECTAL | Status: DC | PRN

## 2016-06-07 MED ORDER — BUPIVACAINE-EPINEPHRINE (PF) 0.25% -1:200000 IJ SOLN
INTRAMUSCULAR | Status: DC | PRN
  Administered 2016-06-07: 30 mL via PERINEURAL

## 2016-06-07 MED ORDER — DIPHENHYDRAMINE HCL 12.5 MG/5ML PO ELIX: 12.5000 mg | ORAL_SOLUTION | ORAL | Status: DC | PRN

## 2016-06-07 MED ORDER — FENTANYL CITRATE (PF) 100 MCG/2ML IJ SOLN: 25.0000 ug | INTRAMUSCULAR | Status: DC | PRN

## 2016-06-07 MED ORDER — ACETAMINOPHEN 325 MG PO TABS
650.0000 mg | ORAL_TABLET | Freq: Four times a day (QID) | ORAL | Status: DC | PRN
  Administered 2016-06-08: 650 mg via ORAL
  Filled 2016-06-07: qty 2

## 2016-06-07 MED ORDER — ENOXAPARIN SODIUM 40 MG/0.4ML ~~LOC~~ SOLN
40.0000 mg | SUBCUTANEOUS | Status: DC
  Administered 2016-06-08 – 2016-06-10 (×3): 40 mg via SUBCUTANEOUS
  Filled 2016-06-07 (×3): qty 0.4

## 2016-06-07 MED ORDER — OXYCODONE HCL 5 MG PO TABS
5.0000 mg | ORAL_TABLET | ORAL | Status: DC | PRN
  Administered 2016-06-07 – 2016-06-10 (×8): 10 mg via ORAL
  Filled 2016-06-07 (×8): qty 2

## 2016-06-07 MED ORDER — SODIUM CHLORIDE FLUSH 0.9 % IV SOLN
INTRAVENOUS | Status: AC
  Filled 2016-06-07: qty 10

## 2016-06-07 MED ORDER — DOCUSATE SODIUM 100 MG PO CAPS
100.0000 mg | ORAL_CAPSULE | Freq: Two times a day (BID) | ORAL | Status: DC
  Administered 2016-06-07 – 2016-06-10 (×6): 100 mg via ORAL
  Filled 2016-06-07 (×6): qty 1

## 2016-06-07 MED ORDER — HYDROMORPHONE HCL 1 MG/ML IJ SOLN
INTRAMUSCULAR | Status: AC
  Administered 2016-06-07: 0.5 mg via INTRAVENOUS
  Filled 2016-06-07: qty 1

## 2016-06-07 MED ORDER — SODIUM CHLORIDE 0.9 % IV SOLN
INTRAVENOUS | Status: DC | PRN
  Administered 2016-06-07: 60 mL

## 2016-06-07 MED ORDER — HYDROMORPHONE HCL 1 MG/ML IJ SOLN
0.2500 mg | INTRAMUSCULAR | Status: DC | PRN
  Administered 2016-06-07 (×3): 0.5 mg via INTRAVENOUS

## 2016-06-07 MED ORDER — LIDOCAINE HCL 2 % EX GEL
CUTANEOUS | Status: AC
  Filled 2016-06-07: qty 5

## 2016-06-07 MED ORDER — TRANEXAMIC ACID 1000 MG/10ML IV SOLN
INTRAVENOUS | Status: DC | PRN
  Administered 2016-06-07: 1000 mg via TOPICAL

## 2016-06-07 MED ORDER — ACETAMINOPHEN 650 MG RE SUPP: 650.0000 mg | Freq: Four times a day (QID) | RECTAL | Status: DC | PRN

## 2016-06-07 MED ORDER — MIDAZOLAM HCL 5 MG/5ML IJ SOLN
INTRAMUSCULAR | Status: DC | PRN
  Administered 2016-06-07 (×2): 1 mg via INTRAVENOUS

## 2016-06-07 MED ORDER — KCL IN DEXTROSE-NACL 20-5-0.9 MEQ/L-%-% IV SOLN
INTRAVENOUS | Status: DC
  Administered 2016-06-07: 21:00:00 via INTRAVENOUS
  Filled 2016-06-07 (×8): qty 1000

## 2016-06-07 MED ORDER — PROPOFOL 500 MG/50ML IV EMUL
INTRAVENOUS | Status: AC
Start: 2016-06-07 — End: 2016-06-07
  Filled 2016-06-07: qty 50

## 2016-06-07 MED ORDER — BUPIVACAINE IN DEXTROSE 0.75-8.25 % IT SOLN
INTRATHECAL | Status: DC | PRN
  Administered 2016-06-07: 1.6 mL via INTRATHECAL

## 2016-06-07 MED ORDER — ACETAMINOPHEN 500 MG PO TABS
1000.0000 mg | ORAL_TABLET | Freq: Four times a day (QID) | ORAL | Status: AC
  Administered 2016-06-07 – 2016-06-08 (×3): 1000 mg via ORAL
  Filled 2016-06-07 (×4): qty 2

## 2016-06-07 MED ORDER — LACTATED RINGERS IV SOLN
INTRAVENOUS | Status: DC
  Administered 2016-06-07 (×2): via INTRAVENOUS

## 2016-06-07 MED ORDER — PROPOFOL 500 MG/50ML IV EMUL
INTRAVENOUS | Status: DC | PRN
  Administered 2016-06-07: 70 ug/kg/min via INTRAVENOUS

## 2016-06-07 MED ORDER — KETOROLAC TROMETHAMINE 15 MG/ML IJ SOLN
15.0000 mg | Freq: Four times a day (QID) | INTRAMUSCULAR | Status: AC
  Administered 2016-06-07 – 2016-06-08 (×3): 15 mg via INTRAVENOUS
  Filled 2016-06-07 (×4): qty 1

## 2016-06-07 MED ORDER — ONDANSETRON HCL 4 MG PO TABS: 4.0000 mg | ORAL_TABLET | Freq: Four times a day (QID) | ORAL | Status: DC | PRN

## 2016-06-07 MED ORDER — MIDAZOLAM HCL 2 MG/2ML IJ SOLN
INTRAMUSCULAR | Status: AC
  Filled 2016-06-07: qty 2

## 2016-06-07 MED ORDER — OXYMETAZOLINE HCL 0.05 % NA SOLN
NASAL | Status: AC
  Filled 2016-06-07: qty 15

## 2016-06-07 MED ORDER — NEOMYCIN-POLYMYXIN B GU 40-200000 IR SOLN
Status: DC | PRN
  Administered 2016-06-07: 4 mL

## 2016-06-07 MED ORDER — KETOROLAC TROMETHAMINE 15 MG/ML IJ SOLN: 30.0000 mg | Freq: Once | INTRAMUSCULAR | Status: DC

## 2016-06-07 MED ORDER — HYDROMORPHONE HCL 1 MG/ML IJ SOLN: 1.0000 mg | INTRAMUSCULAR | Status: DC | PRN

## 2016-06-07 MED ORDER — MAGNESIUM HYDROXIDE 400 MG/5ML PO SUSP
30.0000 mL | Freq: Every day | ORAL | Status: DC | PRN
  Administered 2016-06-08 – 2016-06-10 (×3): 30 mL via ORAL
  Filled 2016-06-07 (×3): qty 30

## 2016-06-07 MED ORDER — ONDANSETRON HCL 4 MG/2ML IJ SOLN
INTRAMUSCULAR | Status: AC
  Administered 2016-06-07: 4 mg via INTRAVENOUS
  Filled 2016-06-07: qty 2

## 2016-06-07 MED ORDER — CEFAZOLIN SODIUM-DEXTROSE 2-3 GM-% IV SOLR
INTRAVENOUS | Status: DC | PRN
  Administered 2016-06-07: 2 g via INTRAVENOUS

## 2016-06-07 MED ORDER — BISACODYL 10 MG RE SUPP: 10.0000 mg | Freq: Every day | RECTAL | Status: DC | PRN

## 2016-06-07 MED ORDER — ONDANSETRON HCL 4 MG/2ML IJ SOLN
4.0000 mg | Freq: Once | INTRAMUSCULAR | Status: AC | PRN
  Administered 2016-06-07: 4 mg via INTRAVENOUS

## 2016-06-07 MED ORDER — PROPOFOL 10 MG/ML IV BOLUS
INTRAVENOUS | Status: DC | PRN
  Administered 2016-06-07: 50 mg via INTRAVENOUS

## 2016-06-07 MED ORDER — PROPOFOL 500 MG/50ML IV EMUL
INTRAVENOUS | Status: AC
  Filled 2016-06-07: qty 50

## 2016-06-07 MED ORDER — TETRACAINE HCL 1 % IJ SOLN
INTRAMUSCULAR | Status: DC | PRN
  Administered 2016-06-07: 6 mg via INTRASPINAL

## 2016-06-07 MED ORDER — METOCLOPRAMIDE HCL 5 MG/ML IJ SOLN: 5.0000 mg | Freq: Three times a day (TID) | INTRAMUSCULAR | Status: DC | PRN

## 2016-06-07 MED ORDER — CEFAZOLIN SODIUM-DEXTROSE 2-4 GM/100ML-% IV SOLN
2.0000 g | Freq: Four times a day (QID) | INTRAVENOUS | Status: AC
  Administered 2016-06-07 – 2016-06-08 (×3): 2 g via INTRAVENOUS
  Filled 2016-06-07 (×3): qty 100

## 2016-06-07 MED ORDER — ONDANSETRON HCL 4 MG/2ML IJ SOLN
4.0000 mg | Freq: Four times a day (QID) | INTRAMUSCULAR | Status: DC | PRN
  Administered 2016-06-08: 4 mg via INTRAVENOUS
  Filled 2016-06-07: qty 2

## 2016-06-07 MED ORDER — METOCLOPRAMIDE HCL 10 MG PO TABS
5.0000 mg | ORAL_TABLET | Freq: Three times a day (TID) | ORAL | Status: DC | PRN
  Administered 2016-06-08: 10 mg via ORAL

## 2016-06-07 MED ORDER — TETRACAINE HCL 1 % IJ SOLN
INTRAMUSCULAR | Status: AC
  Filled 2016-06-07: qty 2

## 2016-06-07 SURGICAL SUPPLY — 62 items
AUTOTRANSFUS HAS 1/8 (MISCELLANEOUS)
BANDAGE ACE 4X5 VEL STRL LF (GAUZE/BANDAGES/DRESSINGS) ×3 IMPLANT
BANDAGE ACE 6X5 VEL STRL LF (GAUZE/BANDAGES/DRESSINGS) ×3 IMPLANT
BLADE SAGITTAL AGGR TOOTH XLG (BLADE) ×3 IMPLANT
BLADE SAW SAG 29X58X.64 (BLADE) ×3 IMPLANT
BNDG COHESIVE 6X5 TAN STRL LF (GAUZE/BANDAGES/DRESSINGS) ×3 IMPLANT
BOWL CEMENT MIXING ADV NOZZLE (MISCELLANEOUS) ×3 IMPLANT
CANISTER SUCT 1200ML W/VALVE (MISCELLANEOUS) ×3 IMPLANT
CANISTER SUCT 3000ML (MISCELLANEOUS) ×6 IMPLANT
CAPT KNEE TOTAL 3 ×3 IMPLANT
CEMENT BONE 1-PACK (Cement) ×6 IMPLANT
CHLORAPREP W/TINT 26ML (MISCELLANEOUS) ×6 IMPLANT
COOLER POLAR GLACIER W/PUMP (MISCELLANEOUS) ×3 IMPLANT
DRAPE IMP U-DRAPE 54X76 (DRAPES) ×6 IMPLANT
DRAPE INCISE IOBAN 66X45 STRL (DRAPES) ×3 IMPLANT
DRAPE SURG 17X11 SM STRL (DRAPES) ×6 IMPLANT
DRSG OPSITE POSTOP 4X12 (GAUZE/BANDAGES/DRESSINGS) ×6 IMPLANT
ELECT CAUTERY BLADE 6.4 (BLADE) ×3 IMPLANT
ELECT REM PT RETURN 9FT ADLT (ELECTROSURGICAL) ×3
ELECTRODE REM PT RTRN 9FT ADLT (ELECTROSURGICAL) ×1 IMPLANT
GAUZE PETRO XEROFOAM 1X8 (MISCELLANEOUS) IMPLANT
GAUZE SPONGE 4X4 12PLY STRL (GAUZE/BANDAGES/DRESSINGS) IMPLANT
GLOVE BIO SURGEON STRL SZ7.5 (GLOVE) ×6 IMPLANT
GLOVE BIO SURGEON STRL SZ8 (GLOVE) ×6 IMPLANT
GLOVE BIOGEL M 7.0 STRL (GLOVE) ×6 IMPLANT
GLOVE BIOGEL PI IND STRL 7.5 (GLOVE) ×2 IMPLANT
GLOVE BIOGEL PI INDICATOR 7.5 (GLOVE) ×4
GLOVE INDICATOR 8.0 STRL GRN (GLOVE) ×3 IMPLANT
GLOVE SURG SYN 6.5 ES PF (GLOVE) ×3 IMPLANT
GOWN STRL REUS W/ TWL LRG LVL3 (GOWN DISPOSABLE) ×2 IMPLANT
GOWN STRL REUS W/ TWL XL LVL3 (GOWN DISPOSABLE) ×1 IMPLANT
GOWN STRL REUS W/TWL LRG LVL3 (GOWN DISPOSABLE) ×4
GOWN STRL REUS W/TWL XL LVL3 (GOWN DISPOSABLE) ×2
HANDPIECE INTERPULSE COAX TIP (DISPOSABLE) ×2
HEMOVAC 400CC 10FR (MISCELLANEOUS) IMPLANT
HOOD PEEL AWAY FLYTE STAYCOOL (MISCELLANEOUS) ×9 IMPLANT
IMMBOLIZER KNEE 19 BLUE UNIV (SOFTGOODS) ×3 IMPLANT
KIT RM TURNOVER STRD PROC AR (KITS) ×3 IMPLANT
NDL SAFETY 18GX1.5 (NEEDLE) ×3 IMPLANT
NEEDLE 18GX1X1/2 (RX/OR ONLY) (NEEDLE) ×3 IMPLANT
NEEDLE SPNL 20GX3.5 QUINCKE YW (NEEDLE) ×3 IMPLANT
NS IRRIG 1000ML POUR BTL (IV SOLUTION) ×3 IMPLANT
PACK TOTAL KNEE (MISCELLANEOUS) ×3 IMPLANT
PAD WRAPON POLAR KNEE (MISCELLANEOUS) ×1 IMPLANT
SET HNDPC FAN SPRY TIP SCT (DISPOSABLE) ×1 IMPLANT
SOL .9 NS 3000ML IRR  AL (IV SOLUTION) ×2
SOL .9 NS 3000ML IRR UROMATIC (IV SOLUTION) ×1 IMPLANT
STAPLER SKIN PROX 35W (STAPLE) ×3 IMPLANT
SUCTION FRAZIER HANDLE 10FR (MISCELLANEOUS) ×2
SUCTION TUBE FRAZIER 10FR DISP (MISCELLANEOUS) ×1 IMPLANT
SUT VIC AB 0 CT1 36 (SUTURE) ×12 IMPLANT
SUT VIC AB 2-0 CT1 27 (SUTURE) ×4
SUT VIC AB 2-0 CT1 TAPERPNT 27 (SUTURE) ×2 IMPLANT
SUT VIC AB 2-0 CT2 27 (SUTURE) ×12 IMPLANT
SYR 10ML LL (SYRINGE) ×3 IMPLANT
SYR 20CC LL (SYRINGE) ×3 IMPLANT
SYR 30ML LL (SYRINGE) ×6 IMPLANT
SYR 50ML LL SCALE MARK (SYRINGE) ×6 IMPLANT
SYSTEM AUTOTRANSFUS DUAL TROCR (MISCELLANEOUS) IMPLANT
SYSTEM VACUUM CEMENT MIXING ×3 IMPLANT
TRAY FOLEY CATH SILVER 16FR LF (SET/KITS/TRAYS/PACK) ×3 IMPLANT
WRAPON POLAR PAD KNEE (MISCELLANEOUS) ×3

## 2016-06-07 NOTE — OR Nursing (Signed)
Ancef  2gm, sacral drsg will be sent to OR with pt Thermal cap in place to OR

## 2016-06-07 NOTE — OR Nursing (Signed)
Lab tech in for T&S draw

## 2016-06-07 NOTE — Anesthesia Procedure Notes (Signed)
Spinal  Patient location during procedure: OR Staffing Anesthesiologist: Gunnar Bulla Performed: anesthesiologist  Preanesthetic Checklist Completed: patient identified, site marked, surgical consent, pre-op evaluation, timeout performed, IV checked and risks and benefits discussed Spinal Block Patient position: sitting Prep: Betadine Patient monitoring: heart rate, cardiac monitor, continuous pulse ox and blood pressure Approach: midline Location: L3-4 Injection technique: single-shot Needle Needle type: Pencil-Tip  Needle gauge: 25 G Needle length: 9 cm Assessment Sensory level: T10 Additional Notes 1553 marcaine 1.32ml and tetracaine 6 mg.

## 2016-06-07 NOTE — H&P (Signed)
Paper H&P to be scanned into permanent record. H&P reviewed. No changes. 

## 2016-06-07 NOTE — Op Note (Addendum)
06/04/2016 - 06/07/2016  6:22 PM  Patient:   Alexander Woods  Pre-Op Diagnosis:   Failed UKA, right knee.  Post-Op Diagnosis:   Same.  Procedure:   Revision right TKA using all-cemented [employer] Vanguard system with a 67.5 mm mm PCR femur, a 79 mm tibial tray with a 16 mm E-poly insert, and a 37 x 10 mm all-poly 3-pegged domed patella.  Surgeon:   Pascal Lux, MD  Assistant:   Cameron Proud, PA-C   Anesthesia:   Spinal  Findings:   As above  Complications:   None  EBL:   25 cc  Fluids:   1400 cc crystalloid  UOP:   225 cc  TT:   109 minutes at 300 mmHg  Drains:   None  Closure:   Staples  Implants:   As above  Brief Clinical Note:   The patient is a 71 year old male who is now 6 months status post a right partial knee replacement. The patient's postoperative course has been complicated by a dissociation of the polyethylene component requiring an operation to reposition a thicker meniscal bearing insert. The patient again was doing well until he redislocated the polyethylene component several days ago. The patient presents at this time for a revision right total knee arthroplasty.  Procedure:   The patient was brought into the operating room. After adequate spinal anesthesia was obtained, the patient was lain in the supine position. A Foley catheter was placed by the nurse before the right lower extremity was prepped with ChloraPrep solution and draped sterilely. Preoperative antibiotics were administered. After verifying the proper laterality with a surgical timeout, the limb was exsanguinated with an Esmarch and the tourniquet inflated to 300 mmHg. A standard anterior approach to the knee was made through an approximately 7 inch incision, incorporating his prior incision. The incision was carried down through the subcutaneous tissues to expose superficial retinaculum. This was split the length of the incision and the medial flap elevated sufficiently to expose the medial  retinaculum. The medial retinaculum was incised, leaving a 3-4 mm cuff of tissue on the patella. This was extended distally along the medial border of the patellar tendon and proximally through the medial third of the quadriceps tendon. The disassociated meniscal bearing insert was identified and removed before the soft tissues were elevated off the anteromedial and anterolateral aspects of the proximal tibia to the level of the collateral ligaments. The anterior portions of the lateral meniscus was removed, as was the anterior cruciate ligament. With the knee flexed to 90, the femoral and tibial components were removed using flexible osteotomes to dissociate the component from the underlying bone. The external tibial guide was positioned and the appropriate proximal tibial cut made.   Attention was directed to the distal femur. The intramedullary canal was accessed through a 3/8" drill hole. The intramedullary guide was inserted and position in order to obtain a neutral flexion gap. The intercondylar block was positioned with care taken to avoid notching the anterior cortex of the femur. The appropriate cut was made. Next, the distal cutting block was placed at 5 of valgus alignment. Using the 9 mm slot, the distal cut was made. The distal femur was measured and found to be optimally replicated by the [id number] mm component. The 67.5 mm 4-in-1 cutting block was positioned and first the posterior, then the posterior chamfer, the anterior chamfer, femoral and intercondylar cuts were made. At this point, the posterior portions medial and lateral menisci were removed. A trial reduction  was performed using the appropriate femoral and tibial components with first the 10 mm, the 12 mm, the 14 mm, and finally the 16 mm insert. The 16 mm insert demonstrated excellent stability to varus and valgus stressing both in flexion and extension while permitting full extension. Patella tracking was assessed and found to be excellent.  Therefore, the tibial guide position was marked on the proximal tibia. The patellar thickness was measured and found to be 23 mm. Therefore, the appropriate cut was made. The surfaces were measured and found to be optimally replicated by the 37 mm component. The three peg holes were drilled in place before the trial button was inserted. Patella tracking was assessed and found to be excellent, passing the "no thumb test". The lug holes were drilled into the distal femur before the trial component was removed, leaving only the tibial tray. The keel was then created using the appropriate tower, reamer, and punch.  The bony surfaces were prepared for cementing by irrigating thoroughly with bacitracin saline solution. A bone plug was fashioned from some of the bone that had been removed previously and used to plug the distal femoral canal. In addition, 20 cc of Exparel diluted out to 60 cc with normal saline and 30 cc of 0.5% Sensorcaine were injected into the postero-medial and postero-lateral aspects of the knee, the medial and lateral gutter regions, and the peri-incisional tissues to help with postoperative analgesia. Meanwhile, the cement was being mixed on the back table. When it was ready, the tibial tray was cemented in first. The excess cement was removed using [employer] fast streamer. Next, the femoral component was impacted into place. Again, the excess cement was removed using [employer] fast streamer. The 16 mm trial insert was positioned and the knee brought into extension while the cement hardened. Finally, the patella was cemented into place and secured using the patellar clamp. Again, the excess cement was removed using [employer] fast streamer. Once the cement had hardened, the knee was placed through a range of motion with the findings as described above. Therefore, the trial insert was removed and, after verifying that no cement had been retained posteriorly, the permanent 16 mm insert was positioned and secured using the  appropriate key locking mechanism. Again the knee was placed through a range of motion with the findings as described above.  The wound was copiously irrigated with bacitracin saline solution using the jet lavage system before the quadriceps tendon and retinacular layer were reapproximated using #0 Vicryl interrupted sutures. The superficial retinacular layer was closed using 2-0 Vicryl interrupted sutures in several layers for the skin was closed using staples. A sterile honeycomb dressing was applied to the skin before the leg was wrapped with an Ace wrap to accommodate the polar pack. The patient was then awakened and returned to the recovery room in satisfactory condition after tolerating the procedure well.

## 2016-06-07 NOTE — Transfer of Care (Signed)
Immediate Anesthesia Transfer of Care Note  Patient: Alexander Woods  Procedure(s) Performed: Procedure(s): TOTAL KNEE REVISION (Right)  Patient Location: PACU  Anesthesia Type:Spinal  Level of Consciousness: awake, alert , oriented and patient cooperative  Airway & Oxygen Therapy: Patient Spontanous Breathing and Patient connected to nasal cannula oxygen  Post-op Assessment: Report given to RN and Post -op Vital signs reviewed and stable  Post vital signs: Reviewed and stable  Last Vitals:  Vitals:   06/07/16 0810 06/07/16 1419  BP: (!) 149/102 (!) 152/104  Pulse: 71 82  Resp: 18 16  Temp: 36.7 C 36.3 C    Last Pain:  Vitals:   06/07/16 1419  TempSrc: Tympanic  PainSc:          Complications: No apparent anesthesia complications

## 2016-06-07 NOTE — Anesthesia Preprocedure Evaluation (Addendum)
Anesthesia Evaluation  Patient identified by MRN, date of birth, ID band Patient awake    Reviewed: Allergy & Precautions, NPO status , Patient's Chart, lab work & pertinent test results, reviewed documented beta blocker date and time   Airway Mallampati: II  TM Distance: >3 FB     Dental  (+) Chipped   Pulmonary sleep apnea ,           Cardiovascular      Neuro/Psych  Neuromuscular disease    GI/Hepatic hiatal hernia, GERD  Controlled,  Endo/Other    Renal/GU      Musculoskeletal  (+) Arthritis ,   Abdominal   Peds  (+) NICU stay Hematology   Anesthesia Other Findings NOT ALLERGIC TO FENTANYL. Discussed with patient.  Reproductive/Obstetrics                            Anesthesia Physical Anesthesia Plan  ASA: III  Anesthesia Plan: Spinal   Post-op Pain Management:    Induction:   Airway Management Planned:   Additional Equipment:   Intra-op Plan:   Post-operative Plan:   Informed Consent: I have reviewed the patients History and Physical, chart, labs and discussed the procedure including the risks, benefits and alternatives for the proposed anesthesia with the patient or authorized representative who has indicated his/her understanding and acceptance.     Plan Discussed with: CRNA  Anesthesia Plan Comments:         Anesthesia Quick Evaluation

## 2016-06-08 ENCOUNTER — Encounter: Payer: Self-pay | Admitting: Surgery

## 2016-06-08 LAB — CBC WITH DIFFERENTIAL/PLATELET
BASOS ABS: 0 10*3/uL (ref 0–0.1)
Basophils Relative: 0 %
EOS ABS: 0 10*3/uL (ref 0–0.7)
EOS PCT: 0 %
HCT: 39 % — ABNORMAL LOW (ref 40.0–52.0)
Hemoglobin: 12.8 g/dL — ABNORMAL LOW (ref 13.0–18.0)
Lymphocytes Relative: 10 %
Lymphs Abs: 1.3 10*3/uL (ref 1.0–3.6)
MCH: 26 pg (ref 26.0–34.0)
MCHC: 32.8 g/dL (ref 32.0–36.0)
MCV: 79.4 fL — ABNORMAL LOW (ref 80.0–100.0)
Monocytes Absolute: 0.9 10*3/uL (ref 0.2–1.0)
Monocytes Relative: 7 %
Neutro Abs: 10.6 10*3/uL — ABNORMAL HIGH (ref 1.4–6.5)
Neutrophils Relative %: 83 %
PLATELETS: 190 10*3/uL (ref 150–440)
RBC: 4.91 MIL/uL (ref 4.40–5.90)
RDW: 14.9 % — ABNORMAL HIGH (ref 11.5–14.5)
WBC: 12.8 10*3/uL — AB (ref 3.8–10.6)

## 2016-06-08 LAB — BASIC METABOLIC PANEL
ANION GAP: 4 — AB (ref 5–15)
BUN: 16 mg/dL (ref 6–20)
CO2: 27 mmol/L (ref 22–32)
Calcium: 8.8 mg/dL — ABNORMAL LOW (ref 8.9–10.3)
Chloride: 106 mmol/L (ref 101–111)
Creatinine, Ser: 0.89 mg/dL (ref 0.61–1.24)
Glucose, Bld: 135 mg/dL — ABNORMAL HIGH (ref 65–99)
POTASSIUM: 4.9 mmol/L (ref 3.5–5.1)
SODIUM: 137 mmol/L (ref 135–145)

## 2016-06-08 NOTE — Progress Notes (Signed)
Physical Therapy Treatment Patient Details Name: Alexander Woods MRN: [id number] DOB: [birth year] Today's Date: 06/08/2016    History of Present Illness Pt is a 71 y/o M who sustained an injury to the R knee feeling a pop when getting up from a stool. X-rays in the ED demonstrated a recurrent dislocation of the R kne poly insert. Pt is now s/p revision of R knee to a TKA.  The patient is s/p a right partial knee replacement performed by Dr. Roland Rack on 12/17/15, in November of this year the patient stepped in a hole and suffered a twisted knee injury with resulted in a dislocation of the poly-insert in the right knee, he then underwent a poly-exchange on 04/06/16. Pt's PMH includes lumbar canal stenosis, leg cramps, cancer, plantar fasciitis.     PT Comments    Pt agreeable to PT; notes a little more stiff and sore in right knee this afternoon. Pt educated/cued on more equal step lengths with ambulation and greater use of Right lower extremity with transfers. Pt demonstrates stair climbing this session with reminders of proper sequence; demonstrates well. Pt returned to bed post session to allow for time in the CPM machine. Continue PT to progress strength, range and quality of transfers and ambulation for improved functional mobility to allow for an optimal, safe return home.   Follow Up Recommendations  Home health PT     Equipment Recommendations  None recommended by PT    Recommendations for Other Services       Precautions / Restrictions Precautions Precautions: Knee;Fall Precaution Booklet Issued: No Restrictions Weight Bearing Restrictions: Yes RLE Weight Bearing: Weight bearing as tolerated    Mobility  Bed Mobility Overal bed mobility: Modified Independent Bed Mobility: Sit to Supine     Supine to sit: Modified independent (Device/Increase time)     General bed mobility comments: instructed in hook technique; pt demonstrates well  Transfers Overall transfer level: Needs  assistance Equipment used: Rolling walker (2 wheeled) Transfers: Sit to/from Stand Sit to Stand: Supervision         General transfer comment: cues to use RLE during transfer. Pt request bed raised to ease sitting. Did not raise and encouraged use of RLE until needing to rely on LLE more so  Ambulation/Gait Ambulation/Gait assistance: Min guard Ambulation Distance (Feet): 170 Feet Assistive device: Rolling walker (2 wheeled) Gait Pattern/deviations: Step-through pattern;Decreased step length - left;Decreased weight shift to right;Antalgic (partial step through) Gait velocity: decreased Gait velocity interpretation: Below normal speed for age/gender General Gait Details: cues for more equal step lengths. Cues for increased QS with stance phase on R   Stairs Stairs: Yes   Stair Management: Two rails;Forwards Number of Stairs: 4 General stair comments: Reminder cues for sequence and to place hands more forward on rails with descent  Wheelchair Mobility    Modified Rankin (Stroke Patients Only)       Balance Overall balance assessment: Needs assistance Sitting-balance support: Feet supported Sitting balance-Leahy Scale: Good     Standing balance support: Bilateral upper extremity supported Standing balance-Leahy Scale: Fair                      Cognition Arousal/Alertness: Awake/alert Behavior During Therapy: WFL for tasks assessed/performed Overall Cognitive Status: Within Functional Limits for tasks assessed                      Exercises Total Joint Exercises Quad Sets: Strengthening;Both;20 reps;Supine (ankle roll on R) Knee  Flexion: AROM;Right;10 reps;Seated (3 positions, 10 sec hold each position)    General Comments        Pertinent Vitals/Pain Pain Assessment: 0-10 Pain Score: 5  Pain Location: R knee Pain Intervention(s): Monitored during session;Repositioned;Ice applied    Home Living                      Prior Function             PT Goals (current goals can now be found in the care plan section) Progress towards PT goals: Progressing toward goals    Frequency    BID      PT Plan Current plan remains appropriate    Co-evaluation             End of Session Equipment Utilized During Treatment: Gait belt Activity Tolerance: Patient tolerated treatment well Patient left: in bed;with call bell/phone within reach;with bed alarm set;Other (comment) (nursing assistant in to place CPM; CP in place)     Time: [id number] PT Time Calculation (min) (ACUTE ONLY): 29 min  Charges:  $Gait Training: 8-22 mins $Therapeutic Exercise: 8-22 mins                    G Codes:      Larae Grooms, PTA 06/08/2016, 3:36 PM

## 2016-06-08 NOTE — Evaluation (Signed)
Physical Therapy Evaluation Patient Details Name: Alexander Woods MRN: [id number] DOB: July 08, 1945 Today's Date: 06/08/2016   History of Present Illness  Pt is a 71 y/o M who sustained an injury to the R knee feeling a pop when getting up from a stool. X-rays in the ED demonstrated a recurrent dislocation of the R kne poly insert. Pt is now s/p revision of R knee to a TKA.  The patient is s/p a right partial knee replacement performed by Dr. Roland Rack on 12/17/15, in November of this year the patient stepped in a hole and suffered a twisted knee injury with resulted in a dislocation of the poly-insert in the right knee, he then underwent a poly-exchange on 04/06/16. Pt's PMH includes lumbar canal stenosis, leg cramps, cancer, plantar fasciitis.     Clinical Impression  Pt is s/p R TKA resulting in the deficits listed below (see PT Problem List). Mr. Than is very familiar with the rehab process given his PMH documented above.  He ambulated 450 ft using RW and cues for upright posture.  He will be returning home at d/c where he will have 24/7 supervision/assist from his wife if needed. Pt will benefit from skilled PT to increase their independence and safety with mobility to allow discharge to the venue listed below.     Follow Up Recommendations Home health PT (pt requests HHPT rather than OPPT )    Equipment Recommendations  None recommended by PT    Recommendations for Other Services       Precautions / Restrictions Precautions Precautions: Knee;Fall Precaution Booklet Issued: No Precaution Comments: Reviewed no pillow under knee Restrictions Weight Bearing Restrictions: Yes RLE Weight Bearing: Weight bearing as tolerated      Mobility  Bed Mobility Overal bed mobility: Needs Assistance Bed Mobility: Supine to Sit     Supine to sit: Min guard;HOB elevated     General bed mobility comments: Increased time and effort, pt uses bed rail to pull up to sitting.  Transfers Overall  transfer level: Needs assistance Equipment used: Rolling walker (2 wheeled) Transfers: Sit to/from Stand Sit to Stand: Min guard         General transfer comment: Cues for proper hand placement and technique.  Pt slow to stand and cues provided for upright posture once standing.   Ambulation/Gait Ambulation/Gait assistance: Min guard Ambulation Distance (Feet): 450 Feet Assistive device: Rolling walker (2 wheeled) Gait Pattern/deviations: Step-through pattern;Decreased stride length;Decreased weight shift to right;Decreased stance time - right;Decreased step length - left;Antalgic;Trunk flexed Gait velocity: decreased Gait velocity interpretation: Below normal speed for age/gender General Gait Details: Cues for upright posture and forward gaze and to relax shoulders.  Pt performs step through gait pattern as he recalled from previous PT.    Stairs            Wheelchair Mobility    Modified Rankin (Stroke Patients Only)       Balance Overall balance assessment: Needs assistance Sitting-balance support: No upper extremity supported;Feet supported Sitting balance-Leahy Scale: Good     Standing balance support: No upper extremity supported;During functional activity Standing balance-Leahy Scale: Fair Standing balance comment: Pt able to stand statically without UE support but relies on RW for dynamic activities                             Pertinent Vitals/Pain Pain Assessment: 0-10 Pain Score: 4  Pain Location: R knee Pain Descriptors / Indicators: Aching;Discomfort Pain  Intervention(s): Limited activity within patient's tolerance;Monitored during session;Repositioned;Premedicated before session;Ice applied    Home Living Family/patient expects to be discharged to:: Private residence Living Arrangements: Spouse/significant other Available Help at Discharge: Family;Available 24 hours/day Type of Home: House Home Access: Stairs to enter Entrance  Stairs-Rails: None Entrance Stairs-Number of Steps: 3 Home Layout: Two level;Able to live on main level with bedroom/bathroom Home Equipment: Gilford Rile - 2 wheels;Cane - single point;Crutches;Other (comment) (Polar care)      Prior Function Level of Independence: Independent         Comments: Has not used AD since 04/09/16.  Denies any falls since revision.  Independent with bathing, dressing, driving. Pt repoorts the only thing he can't do is bend down to groom his dog.     Hand Dominance   Dominant Hand: Right    Extremity/Trunk Assessment   Upper Extremity Assessment Upper Extremity Assessment: Defer to OT evaluation    Lower Extremity Assessment Lower Extremity Assessment: RLE deficits/detail RLE Deficits / Details: limited ROM and strength as expected s/p R TKA       Communication   Communication: No difficulties  Cognition Arousal/Alertness: Awake/alert Behavior During Therapy: WFL for tasks assessed/performed Overall Cognitive Status: Within Functional Limits for tasks assessed                      General Comments General comments (skin integrity, edema, etc.): +emesis 8 oz. at start of session following gentle bed level exercises.  RN notified.    Exercises Total Joint Exercises Ankle Circles/Pumps: AROM;Both;10 reps;Supine Quad Sets: Strengthening;Both;10 reps;Supine Long Arc Quad: Strengthening;Right;10 reps;Seated Knee Flexion: AAROM;Right;10 reps;Seated;Other (comment) (with 5 second holds) Goniometric ROM: 104 deg R knee flexion with AAROM   Assessment/Plan    PT Assessment Patient needs continued PT services  PT Problem List Decreased strength;Decreased range of motion;Decreased activity tolerance;Decreased balance;Decreased mobility;Decreased knowledge of use of DME;Decreased safety awareness;Pain          PT Treatment Interventions DME instruction;Gait training;Stair training;Functional mobility training;Therapeutic activities;Therapeutic  exercise;Balance training;Neuromuscular re-education;Patient/family education;Modalities    PT Goals (Current goals can be found in the Care Plan section)  Acute Rehab PT Goals Patient Stated Goal: to go home PT Goal Formulation: With patient Time For Goal Achievement: 06/15/16 Potential to Achieve Goals: Good    Frequency BID   Barriers to discharge        Co-evaluation               End of Session Equipment Utilized During Treatment: Gait belt Activity Tolerance: Patient tolerated treatment well Patient left: in chair;with call bell/phone within reach;with chair alarm set Nurse Communication: Mobility status;Other (comment);Weight bearing status (+emesis 8 oz.)         Time: [id number] PT Time Calculation (min) (ACUTE ONLY): 42 min   Charges:   PT Evaluation $PT Eval Low Complexity: 1 Procedure PT Treatments $Gait Training: 8-22 mins $Therapeutic Exercise: 8-22 mins   PT G Codes:        Collie Siad PT, DPT 06/08/2016, 10:37 AM

## 2016-06-08 NOTE — Anesthesia Postprocedure Evaluation (Signed)
Anesthesia Post Note  Patient: Alexander Woods  Procedure(s) Performed: Procedure(s) (LRB): TOTAL KNEE REVISION (Right)  Patient location during evaluation: Other Anesthesia Type: Spinal Level of consciousness: awake and alert Pain management: pain level controlled Vital Signs Assessment: post-procedure vital signs reviewed and stable Respiratory status: spontaneous breathing and respiratory function stable Cardiovascular status: blood pressure returned to baseline and stable Postop Assessment: spinal receding Anesthetic complications: no     Last Vitals:  Vitals:   06/08/16 0020 06/08/16 0528  BP: (!) 145/79 139/74  Pulse: 73 71  Resp: 19 19  Temp: 36.8 C 36.6 C    Last Pain:  Vitals:   06/08/16 0614  TempSrc:   PainSc: 4                  Alison Stalling

## 2016-06-08 NOTE — Progress Notes (Addendum)
Pt could not tolerate getting oob on night of surgery r/t pain. Pain is now under control. Pt oob this AM at 0400. Could not do CPM this AM as CPM does not have wool straps. Pt produced 150 ml of bright red emesis this am.

## 2016-06-08 NOTE — Progress Notes (Signed)
  Subjective: 1 Day Post-Op Procedure(s) (LRB): TOTAL KNEE REVISION (Right) Patient reports pain as moderate, aching sensation.   Patient is well, and has had no acute complaints or problems Plan is to go Home after hospital stay. Negative for chest pain and shortness of breath Fever: no Gastrointestinal:Positive for N/V this AM.  Objective: Vital signs in last 24 hours: Temp:  [96.6 F (35.9 C)-98.2 F (36.8 C)] 96.6 F (35.9 C) (01/03 0741) Pulse Rate:  [59-82] 64 (01/03 0739) Resp:  [0-19] 18 (01/03 0739) BP: (120-152)/(68-104) 120/68 (01/03 0739) SpO2:  [90 %-100 %] 97 % (01/03 0739) Weight:  [88.9 kg (196 lb)] 88.9 kg (196 lb) (01/02 1419)  Intake/Output from previous day:  Intake/Output Summary (Last 24 hours) at 06/08/16 0750 Last data filed at 06/08/16 0700  Gross per 24 hour  Intake             1550 ml  Output              965 ml  Net              585 ml    Intake/Output this shift: No intake/output data recorded.  Labs:  Recent Labs  06/08/16 0501  HGB 12.8*    Recent Labs  06/08/16 0501  WBC 12.8*  RBC 4.91  HCT 39.0*  PLT 190    Recent Labs  06/08/16 0501  NA 137  K 4.9  CL 106  CO2 27  BUN 16  CREATININE 0.89  GLUCOSE 135*  CALCIUM 8.8*   No results for input(s): LABPT, INR in the last 72 hours.   EXAM General - Patient is Alert, Appropriate and Oriented Extremity - ABD soft Sensation intact distally Intact pulses distally Incision: dressing C/D/I No cellulitis present Dressing/Incision - clean, dry, no drainage Motor Function - intact, moving foot and toes well on exam. Able to perform a straight leg raise with the right leg.  Abdomen soft with normal BS, mild tympany with percussion.  Past Medical History:  Diagnosis Date  . Arthritis   . BPH with obstruction/lower urinary tract symptoms   . Cancer ([place])   . Cataract   . DDD (degenerative disc disease), lumbar   . DJD (degenerative joint disease)   . GERD  (gastroesophageal reflux disease)   . Heartburn   . HH (hiatus hernia)   . Hypogonadism in male   . Leg cramps   . Prostate cancer ([place])   . Sleep apnea    patient denies    Assessment/Plan: 1 Day Post-Op Procedure(s) (LRB): TOTAL KNEE REVISION (Right) Active Problems:   Right knee pain  Estimated body mass index is 27.34 kg/m as calculated from the following:   Height as of this encounter: 5\' 11"  (1.803 m).   Weight as of this encounter: 88.9 kg (196 lb). Advance diet Up with therapy D/C IV fluids when tolerating PO intake.  Labs reviewed, WBC 12.8, no fevers or tachycardia. Denies passing gas at this time, no bowel distention, good BS. Up with therapy today, will plan on possible discharge home tomorrow.  DVT Prophylaxis - Lovenox, Foot Pumps and TED hose Weight-Bearing as tolerated to right leg  J. Cameron Proud, PA-C [facility] Orthopaedic Surgery 06/08/2016, 7:50 AM

## 2016-06-08 NOTE — Progress Notes (Signed)
PT is recommending home health. RN case manager is aware of above. Please reconsult if future social work needs arise. CSW signing off.   [employer], LCSW [phone]

## 2016-06-08 NOTE — Care Management (Signed)
Pharmacy- [facility] [phone]. Called Lovenox 40 mg # 14 no refills.

## 2016-06-09 ENCOUNTER — Inpatient Hospital Stay: Payer: Medicare Other

## 2016-06-09 ENCOUNTER — Encounter: Payer: Self-pay | Admitting: Surgery

## 2016-06-09 LAB — BASIC METABOLIC PANEL
ANION GAP: 4 — AB (ref 5–15)
BUN: 18 mg/dL (ref 6–20)
CHLORIDE: 106 mmol/L (ref 101–111)
CO2: 26 mmol/L (ref 22–32)
Calcium: 8.4 mg/dL — ABNORMAL LOW (ref 8.9–10.3)
Creatinine, Ser: 0.9 mg/dL (ref 0.61–1.24)
GFR calc non Af Amer: >60 mL/min (ref >60)
GLUCOSE: 105 mg/dL — AB (ref 65–99)
POTASSIUM: 4.5 mmol/L (ref 3.5–5.1)
Sodium: 136 mmol/L (ref 135–145)

## 2016-06-09 LAB — URINALYSIS, COMPLETE (UACMP) WITH MICROSCOPIC
Bacteria, UA: NONE SEEN
Bilirubin Urine: NEGATIVE
Glucose, UA: NEGATIVE mg/dL
HGB URINE DIPSTICK: NEGATIVE
KETONES UR: NEGATIVE mg/dL
Leukocytes, UA: NEGATIVE
NITRITE: NEGATIVE
PH: 5 (ref 5.0–8.0)
Protein, ur: NEGATIVE mg/dL
Specific Gravity, Urine: 1.017 (ref 1.005–1.030)
Squamous Epithelial / LPF: NONE SEEN

## 2016-06-09 LAB — CBC
HCT: 37.3 % — ABNORMAL LOW (ref 40.0–52.0)
HEMOGLOBIN: 12.1 g/dL — AB (ref 13.0–18.0)
MCH: 25.5 pg — ABNORMAL LOW (ref 26.0–34.0)
MCHC: 32.5 g/dL (ref 32.0–36.0)
MCV: 78.6 fL — ABNORMAL LOW (ref 80.0–100.0)
Platelets: 180 10*3/uL (ref 150–440)
RBC: 4.75 MIL/uL (ref 4.40–5.90)
RDW: 15.4 % — ABNORMAL HIGH (ref 11.5–14.5)
WBC: 8.9 10*3/uL (ref 3.8–10.6)

## 2016-06-09 NOTE — Progress Notes (Signed)
Physical Therapy Treatment Patient Details Name: Shawnn Womble MRN: [id number] DOB: [birth year] Today's Date: 06/09/2016    History of Present Illness Pt is a 71 y/o M who sustained an injury to the R knee feeling a pop when getting up from a stool. X-rays in the ED demonstrated a recurrent dislocation of the R kne poly insert. Pt is now s/p revision of R knee to a TKA.  The patient is s/p a right partial knee replacement performed by Dr. Roland Rack on 12/17/15, in November of this year the patient stepped in a hole and suffered a twisted knee injury with resulted in a dislocation of the poly-insert in the right knee, he then underwent a poly-exchange on 04/06/16. Pt's PMH includes lumbar canal stenosis, leg cramps, cancer, plantar fasciitis.     PT Comments    Pt agreeable to PT; reports 6/10 pain R knee. Pt notes CPM machine was on for 16 hours yesterday and knee was notably more sore/aggravated. Pt also notes mild skin irritation from machine on posterior calf. Pt participates in stretching with right knee range 0-87 degrees; pt educated on carry over for home. Ambulation distance and quality once rolling walker (pt's own) adjusted for appropriate fit. Pt received up in chair comfortably. Continue PT this afternoon for continued work on range, strength and all functional mobility.   Follow Up Recommendations  Home health PT     Equipment Recommendations  None recommended by PT    Recommendations for Other Services       Precautions / Restrictions Precautions Precautions: Knee;Fall Restrictions Weight Bearing Restrictions: Yes RLE Weight Bearing: Weight bearing as tolerated    Mobility  Bed Mobility Overal bed mobility: Modified Independent Bed Mobility: Supine to Sit     Supine to sit: Modified independent (Device/Increase time)     General bed mobility comments: Increased time/effort; use of rail  Transfers                    Ambulation/Gait                  Stairs            Wheelchair Mobility    Modified Rankin (Stroke Patients Only)       Balance                                    Cognition Arousal/Alertness: Awake/alert Behavior During Therapy: WFL for tasks assessed/performed Overall Cognitive Status: Within Functional Limits for tasks assessed                      Exercises Total Joint Exercises Quad Sets: Strengthening;20 reps;Right (10x standing) Knee Flexion: AROM;Right;10 reps;Seated (3 positions each rep with 10 sec hold each) Goniometric ROM: 0-87     General Comments        Pertinent Vitals/Pain Pain Assessment: 0-10 Pain Score: 6  Pain Location: R knee Pain Intervention(s): Monitored during session;Premedicated before session;Repositioned;Ice applied    Home Living                      Prior Function            PT Goals (current goals can now be found in the care plan section) Progress towards PT goals: Progressing toward goals    Frequency    BID      PT Plan Current plan remains  appropriate    Co-evaluation             End of Session Equipment Utilized During Treatment: Gait belt Activity Tolerance: Patient tolerated treatment well Patient left: in chair;with call bell/phone within reach;with chair alarm set;Other (comment) (polar care in place)     Time: [id number] PT Time Calculation (min) (ACUTE ONLY): 36 min  Charges:  $Gait Training: 8-22 mins $Therapeutic Exercise: 8-22 mins                    G Codes:      Larae Grooms, PTA 06/09/2016, 2:26 PM

## 2016-06-09 NOTE — Progress Notes (Signed)
  Subjective: 2 Days Post-Op Procedure(s) (LRB): TOTAL KNEE REVISION (Right) Patient reports pain as moderate, aching sensation.   Patient is well, and has had no acute complaints or problems Plan is to go Home after hospital stay. Negative for chest pain and shortness of breath Fever: Yes, 102.2 last night around 10pm last night Gastrointestinal:Positive for N/V this AM.  Objective: Vital signs in last 24 hours: Temp:  [98.3 F (36.8 C)-102.2 F (39 C)] 98.4 F (36.9 C) (01/04 0741) Pulse Rate:  [71-112] 86 (01/04 0741) Resp:  [18] 18 (01/04 0413) BP: (114-134)/(63-79) 134/79 (01/04 0741) SpO2:  [92 %-98 %] 92 % (01/04 0741)  Intake/Output from previous day:  Intake/Output Summary (Last 24 hours) at 06/09/16 1009 Last data filed at 06/09/16 0900  Gross per 24 hour  Intake              480 ml  Output             1000 ml  Net             -520 ml    Intake/Output this shift: Total I/O In: 480 [P.O.:480] Out: 550 [Urine:550]  Labs:  Recent Labs  06/08/16 0501  HGB 12.8*    Recent Labs  06/08/16 0501  WBC 12.8*  RBC 4.91  HCT 39.0*  PLT 190    Recent Labs  06/08/16 0501 06/09/16 0338  NA 137 136  K 4.9 4.5  CL 106 106  CO2 27 26  BUN 16 18  CREATININE 0.89 0.90  GLUCOSE 135* 105*  CALCIUM 8.8* 8.4*   No results for input(s): LABPT, INR in the last 72 hours.   EXAM General - Patient is Alert, Appropriate and Oriented Extremity - ABD soft Sensation intact distally Intact pulses distally Incision: scant drainage No cellulitis present Dressing/Incision - Scant bloody drainage to the right knee. Motor Function - intact, moving foot and toes well on exam. Able to perform a straight leg raise with the right leg with assistance.  Abdomen soft with normal BS, no tympany with percussion.  Past Medical History:  Diagnosis Date  . Arthritis   . BPH with obstruction/lower urinary tract symptoms   . Cancer ([place])   . Cataract   . DDD (degenerative  disc disease), lumbar   . DJD (degenerative joint disease)   . GERD (gastroesophageal reflux disease)   . Heartburn   . HH (hiatus hernia)   . Hypogonadism in male   . Leg cramps   . Prostate cancer ([place])   . Sleep apnea    patient denies    Assessment/Plan: 2 Days Post-Op Procedure(s) (LRB): TOTAL KNEE REVISION (Right) Active Problems:   Right knee pain  Estimated body mass index is 27.34 kg/m as calculated from the following:   Height as of this encounter: 5\' 11"  (1.803 m).   Weight as of this encounter: 88.9 kg (196 lb). Up with therapy   Labs reviewed, will place order for CBC for today and tomorrow morning. Pt had temp last night of 102.2, will add order for blood cultures if fever spikes again today, as well as CXR and UA. Pt is passing gas today, no bowel distention, good BS. Up with therapy today, will plan on possible discharge home tomorrow.  DVT Prophylaxis - Lovenox, Foot Pumps and TED hose Weight-Bearing as tolerated to right leg  J. Cameron Proud, PA-C [facility] Orthopaedic Surgery 06/09/2016, 10:09 AM

## 2016-06-09 NOTE — Progress Notes (Signed)
Physical Therapy Treatment Patient Details Name: Alexander Woods MRN: [id number] DOB: May 09, 1946 Today's Date: 06/09/2016    History of Present Illness Pt is a 71 y/o M who sustained an injury to the R knee feeling a pop when getting up from a stool. X-rays in the ED demonstrated a recurrent dislocation of the R kne poly insert. Pt is now s/p revision of R knee to a TKA.  The patient is s/p a right partial knee replacement performed by Dr. Roland Rack on 12/17/15, in November of this year the patient stepped in a hole and suffered a twisted knee injury with resulted in a dislocation of the poly-insert in the right knee, he then underwent a poly-exchange on 04/06/16. Pt's PMH includes lumbar canal stenosis, leg cramps, cancer, plantar fasciitis.     PT Comments    Pt with a little more stiffness/soreness this afternoon. Pt agreeable to ambulate and wishes back to bed. Pt demonstrating less right knee flexion for functional use this session; worked lightly on bend to allow for a little more use. Pt continues with partial step through ambulation slow, but steady with no loss of balance. Pt requires Min A to return Bilateral lower extremities to bed with hook technique. Min A to manage Right lower extremity for positioning in bed. Continue PT to progress strength, endurance and range to improve all functional mobility and allow for a safe, optimal return home.   Follow Up Recommendations  Home health PT     Equipment Recommendations  None recommended by PT    Recommendations for Other Services       Precautions / Restrictions Precautions Precautions: Knee;Fall Restrictions Weight Bearing Restrictions: Yes RLE Weight Bearing: Weight bearing as tolerated    Mobility  Bed Mobility Overal bed mobility: Needs Assistance Bed Mobility: Sit to Supine     Supine to sit: Modified independent (Device/Increase time);Min assist     General bed mobility comments: Min A for LEs with hook  technique  Transfers Overall transfer level: Modified independent Equipment used: Rolling walker (2 wheeled) Transfers: Sit to/from Stand Sit to Stand: Modified independent (Device/Increase time)         General transfer comment: Decreased use of RLE this afternoon, as bending limited and painful  Ambulation/Gait Ambulation/Gait assistance: Min guard;Supervision Ambulation Distance (Feet): 220 Feet Assistive device: Rolling walker (2 wheeled) Gait Pattern/deviations: Step-through pattern;Decreased step length - left;Decreased weight shift to right;Antalgic (paritial step through) Gait velocity: decreased Gait velocity interpretation: Below normal speed for age/gender General Gait Details: partial step through, steady pattern, no LOB   Stairs            Wheelchair Mobility    Modified Rankin (Stroke Patients Only)       Balance Overall balance assessment: Needs assistance   Sitting balance-Leahy Scale: Normal     Standing balance support: Bilateral upper extremity supported Standing balance-Leahy Scale: Fair (Fair +)                      Cognition Arousal/Alertness: Awake/alert Behavior During Therapy: WFL for tasks assessed/performed Overall Cognitive Status: Within Functional Limits for tasks assessed                      Exercises Total Joint Exercises Quad Sets: Right;Both;10 reps;Standing (pre gait) Knee Flexion:  (perfomred lightly to allow a little use for transfers)    General Comments        Pertinent Vitals/Pain Pain Assessment: 0-10 Pain Score: 7  Pain Location: R knee Pain Intervention(s): Monitored during session;Ice applied;Premedicated before session;Repositioned    Home Living                      Prior Function            PT Goals (current goals can now be found in the care plan section) Progress towards PT goals: Progressing toward goals    Frequency    BID      PT Plan Current plan remains  appropriate    Co-evaluation             End of Session Equipment Utilized During Treatment: Gait belt Activity Tolerance: Patient tolerated treatment well Patient left: with call bell/phone within reach;Other (comment);in bed;with bed alarm set;with SCD's reapplied (polar care in place)     Time: [id number] PT Time Calculation (min) (ACUTE ONLY): 28 min  Charges:  $Gait Training: 8-22 mins $Therapeutic Activity: 8-22 mins                    G CodesLarae Grooms, PTA 06/09/2016, 4:41 PM

## 2016-06-10 MED ORDER — OXYCODONE HCL 5 MG PO TABS: 5.0000 mg | ORAL_TABLET | ORAL | 0 refills | Status: DC | PRN

## 2016-06-10 NOTE — Progress Notes (Signed)
Physical Therapy Treatment Patient Details Name: Alexander Woods MRN: [id number] DOB: 13-Feb-1946 Today's Date: 06/10/2016    History of Present Illness Pt is a 71 y/o M who sustained an injury to the R knee feeling a pop when getting up from a stool. X-rays in the ED demonstrated a recurrent dislocation of the R kne poly insert. Pt is now s/p revision of R knee to a TKA.  The patient is s/p a right partial knee replacement performed by Dr. Roland Rack on 12/17/15, in November of this year the patient stepped in a hole and suffered a twisted knee injury with resulted in a dislocation of the poly-insert in the right knee, he then underwent a poly-exchange on 04/06/16. Pt's PMH includes lumbar canal stenosis, leg cramps, cancer, plantar fasciitis.     PT Comments    Pt is making good progress towards goals and is ready for discharge. Pt has perform stair training and ambulated significant distance using RW. Safe technique with all OOB mobility, still needs assist for control of R LE lowering to floor. Good endurance with there-ex, reviewed written HEP. Safe technique performed. RN notified.  Follow Up Recommendations  Home health PT     Equipment Recommendations  None recommended by PT    Recommendations for Other Services       Precautions / Restrictions Precautions Precautions: Knee;Fall Precaution Booklet Issued: Yes (comment) Restrictions Weight Bearing Restrictions: Yes RLE Weight Bearing: Weight bearing as tolerated    Mobility  Bed Mobility Overal bed mobility: Needs Assistance Bed Mobility: Supine to Sit     Supine to sit: Min guard     General bed mobility comments: assist for guiding R LE off bed and down towards floor. Once seated at EOB, pt able to sit with supervision  Transfers Overall transfer level: Modified independent Equipment used: Rolling walker (2 wheeled) Transfers: Sit to/from Stand Sit to Stand: Modified independent (Device/Increase time)          General transfer comment: Safe technique performed with RW. Upright posture noted  Ambulation/Gait Ambulation/Gait assistance: Supervision Ambulation Distance (Feet): 200 Feet Assistive device: Rolling walker (2 wheeled) Gait Pattern/deviations: Step-through pattern     General Gait Details: Improved reciprocal gait pattern with upright posture noted. Pt with safe use of RW and good gait speed. Slight increased pain with mobility.   Stairs            Wheelchair Mobility    Modified Rankin (Stroke Patients Only)       Balance                                    Cognition Arousal/Alertness: Awake/alert Behavior During Therapy: WFL for tasks assessed/performed Overall Cognitive Status: Within Functional Limits for tasks assessed                      Exercises Total Joint Exercises Goniometric ROM: R knee AAROM: 0-90 degrees and limited by pain.    General Comments        Pertinent Vitals/Pain Pain Assessment: 0-10 Pain Score: 5  Pain Location: R knee Pain Descriptors / Indicators: Aching;Discomfort Pain Intervention(s): Limited activity within patient's tolerance;Ice applied    Home Living                      Prior Function            PT Goals (current goals  can now be found in the care plan section) Acute Rehab PT Goals Patient Stated Goal: to go home PT Goal Formulation: With patient Time For Goal Achievement: 06/15/16 Potential to Achieve Goals: Good Progress towards PT goals: Progressing toward goals    Frequency    BID      PT Plan Current plan remains appropriate    Co-evaluation             End of Session Equipment Utilized During Treatment: Gait belt Activity Tolerance: Patient tolerated treatment well Patient left: with call bell/phone within reach;Other (comment);in bed;with bed alarm set;with SCD's reapplied     Time: [id number] PT Time Calculation (min) (ACUTE ONLY): 33 min  Charges:   $Gait Training: 8-22 mins $Therapeutic Exercise: 8-22 mins                    G Codes:      Lailynn Southgate 10-Jul-2016, 1:31 PM  Greggory Stallion, PT, DPT [phone]

## 2016-06-10 NOTE — Progress Notes (Signed)
The Nurse asked [place] rounding the unit to visit the Pt. Pt was getting ready to be discharged. [place] spoke with the Pt and wished him well. CH provide the ministry of presence.    06/10/16 1500  Clinical Encounter Type  Visited With Patient  Visit Type Initial;Follow-up  Referral From Nurse  Consult/Referral To Chaplain  Spiritual Encounters  Spiritual Needs Prayer

## 2016-06-10 NOTE — Progress Notes (Signed)
  Subjective: 3 Days Post-Op Procedure(s) (LRB): TOTAL KNEE REVISION (Right) Patient reports pain as moderate, aching sensation.   Patient is well, and has had no acute complaints or problems Plan is to go Home after hospital stay. Negative for chest pain and shortness of breath Fever: No fevers over the past 24 hours. Gastrointestinal:Positive for N/V this AM.  Objective: Vital signs in last 24 hours: Temp:  [98.2 F (36.8 C)-98.4 F (36.9 C)] 98.4 F (36.9 C) (01/05 0449) Pulse Rate:  [78-107] 79 (01/05 0449) Resp:  [18] 18 (01/05 0449) BP: (122-144)/(71-73) 122/72 (01/05 0449) SpO2:  [93 %-96 %] 96 % (01/05 0449)  Intake/Output from previous day:  Intake/Output Summary (Last 24 hours) at 06/10/16 0746 Last data filed at 06/10/16 0452  Gross per 24 hour  Intake             1200 ml  Output             1475 ml  Net             -275 ml    Intake/Output this shift: No intake/output data recorded.  Labs:  Recent Labs  06/08/16 0501 06/09/16 0338  HGB 12.8* 12.1*    Recent Labs  06/08/16 0501 06/09/16 0338  WBC 12.8* 8.9  RBC 4.91 4.75  HCT 39.0* 37.3*  PLT 190 180    Recent Labs  06/08/16 0501 06/09/16 0338  NA 137 136  K 4.9 4.5  CL 106 106  CO2 27 26  BUN 16 18  CREATININE 0.89 0.90  GLUCOSE 135* 105*  CALCIUM 8.8* 8.4*   No results for input(s): LABPT, INR in the last 72 hours.   EXAM General - Patient is Alert, Appropriate and Oriented Extremity - ABD soft Sensation intact distally Intact pulses distally Incision: dressing C/D/I No cellulitis present Dressing/Incision - Clean, dry, no drainage Motor Function - intact, moving foot and toes well on exam. Able to perform a straight leg raise with the right leg with assistance.  Abdomen soft with normal BS, no tympany with percussion.  Past Medical History:  Diagnosis Date  . Arthritis   . BPH with obstruction/lower urinary tract symptoms   . Cancer ([place])   . Cataract   . DDD  (degenerative disc disease), lumbar   . DJD (degenerative joint disease)   . GERD (gastroesophageal reflux disease)   . Heartburn   . HH (hiatus hernia)   . Hypogonadism in male   . Leg cramps   . Prostate cancer ([place])   . Sleep apnea    patient denies    Assessment/Plan: 3 Days Post-Op Procedure(s) (LRB): TOTAL KNEE REVISION (Right) Active Problems:   Right knee pain  Estimated body mass index is 27.34 kg/m as calculated from the following:   Height as of this encounter: 5\' 11"  (1.803 m).   Weight as of this encounter: 88.9 kg (196 lb). Discharge home with home health   Labs reviewed, No increased WBC, no fevers today.  CXR negative for pneumonia. Pt is passing gas today, no bowel distention, good BS.  Pt was offered but declines suppository today. Up with therapy today, will plan on discharge home today with HHPT.  DVT Prophylaxis - Lovenox, Foot Pumps and TED hose Weight-Bearing as tolerated to right leg  J. Cameron Proud, PA-C [facility] Orthopaedic Surgery 06/10/2016, 7:46 AM

## 2016-06-10 NOTE — Care Management Important Message (Signed)
Important Message  Patient Details  Name: Prabhnoor Glenney MRN: [id number] Date of Birth: December 17, 1945   Medicare Important Message Given:  Yes    Jolly Mango, RN 06/10/2016, 9:11 AM

## 2016-06-10 NOTE — Progress Notes (Signed)
Pt stating that Lovenox is giving him a rash, PA [employer] notified, orders received to d/c Lovenox at home and use Asprin 325 mg 2 X day for 2 wks. and stop Asprin 81 mg. Pt notified notified and verbalized understanding.

## 2016-06-10 NOTE — Care Management Note (Signed)
Case Management Note  Patient Details  Name: Alexander Woods MRN: [id number] Date of Birth: [birth year]  Subjective/Objective:  Lovenox cancelled due to patient allergy. Discharging on ASA.                   Action/Plan: [facility] notified of discharge.  Expected Discharge Date:    01/05/218             Expected Discharge Plan:  [place]  In-House Referral:     Discharge planning Services  CM Consult  Post Acute Care Choice:  Home Health Choice offered to:  Patient  DME Arranged:    DME Agency:     HH Arranged:  PT [place]:  [facility] (now [facility])  Status of Service:  Completed, signed off  If discussed at [employer] of Stay Meetings, dates discussed:    Additional Comments:  Jolly Mango, RN 06/10/2016, 9:04 AM

## 2016-06-10 NOTE — Discharge Summary (Signed)
Physician Discharge Summary  Patient ID: Alexander Woods MRN: [id number] DOB/AGE: July 21, 1945 71 y.o.  Admit date: 06/04/2016 Discharge date: 06/10/2016  Admission Diagnoses:  Periprosthetic fracture around internal prosthetic right knee joint, initial encounter [M97.11XA] Failed UKA, right knee  Discharge Diagnoses: Patient Active Problem List   Diagnosis Date Noted  . Right knee pain 06/04/2016  . Internal orthopedic device mechanical complication, initial encounter ([place]) 04/06/2016  . Status post right partial knee replacement 12/17/2015  . History of prostate cancer 10/13/2015  . BPH with obstruction/lower urinary tract symptoms 10/13/2015  . H/O nutritional disorder 03/31/2015  . Degeneration of intervertebral disc of lumbar region 11/14/2014  . Neuritis or radiculitis due to rupture of lumbar intervertebral disc 06/09/2014  . Lumbar canal stenosis 06/09/2014  . LBP (low back pain) 01/24/2014  . Acid reflux 11/08/2013  Failed UKA, right knee.  Past Medical History:  Diagnosis Date  . Arthritis   . BPH with obstruction/lower urinary tract symptoms   . Cancer ([address])   . Cataract   . DDD (degenerative disc disease), lumbar   . DJD (degenerative joint disease)   . GERD (gastroesophageal reflux disease)   . Heartburn   . HH (hiatus hernia)   . Hypogonadism in male   . Leg cramps   . Prostate cancer ([place])   . Sleep apnea    patient denies    Transfusion: None   Consultants (if any):   Discharged Condition: Improved  Hospital Course: Alexander Woods is an 71 y.o. male who was admitted 06/04/2016 with a diagnosis of a failed UKA of the right knee and went to the operating room on 06/04/2016 - 06/07/2016 and underwent the above named procedures.    Surgeries: Procedure(s): TOTAL KNEE REVISION on 06/04/2016 - 06/07/2016 Patient tolerated the surgery well. Taken to PACU where she was stabilized and then transferred to the orthopedic floor.  Started on Lovenox 40mg  q 24  hrs. Foot pumps applied bilaterally at 80 mm. Heels elevated on bed with rolled towels. No evidence of DVT. Negative [place]. Physical therapy started on day #1 for gait training and transfer. OT started day #1 for ADL and assisted devices.  Patient's IV and foley were removed on POD1  Implants: All-cemented [employer] Vanguard system with a 67.5 mm mm PCR femur, a 79 mm tibial tray with a 16 mm E-poly insert, and a 37 x 10 mm all-poly 3-pegged domed patella.  He was given perioperative antibiotics:  Anti-infectives    Start     Dose/Rate Route Frequency Ordered Stop   06/07/16 2200  ceFAZolin (ANCEF) IVPB 2g/100 mL premix     2 g 200 mL/hr over 30 Minutes Intravenous Every 6 hours 06/07/16 1952 06/08/16 1149   06/07/16 0000  ceFAZolin (ANCEF) IVPB 2g/100 mL premix  Status:  Discontinued     2 g 200 mL/hr over 30 Minutes Intravenous 30 min pre-op 06/06/16 1714 06/07/16 1952    .  He was given sequential compression devices, early ambulation, and Lovenox for DVT prophylaxis.  He benefited maximally from the hospital stay and there were no complications.    Recent vital signs:  Vitals:   06/09/16 2038 06/10/16 0449  BP: (!) 144/73 122/72  Pulse: 78 79  Resp: 18 18  Temp: 98.2 F (36.8 C) 98.4 F (36.9 C)    Recent laboratory studies:  Lab Results  Component Value Date   HGB 12.1 (L) 06/09/2016   HGB 12.8 (L) 06/08/2016   HGB 13.7 06/04/2016   Lab Results  Component Value Date   WBC 8.9 06/09/2016   PLT 180 06/09/2016   Lab Results  Component Value Date   INR 0.98 04/06/2016   Lab Results  Component Value Date   NA 136 06/09/2016   K 4.5 06/09/2016   CL 106 06/09/2016   CO2 26 06/09/2016   BUN 18 06/09/2016   CREATININE 0.90 06/09/2016   GLUCOSE 105 (H) 06/09/2016    Discharge Medications:   Allergies as of 06/10/2016      Reactions   Bacitracin Anaphylaxis   Bee Venom Anaphylaxis   Ciprofloxacin Anaphylaxis   Erythromycin Ethylsuccinate Anaphylaxis    Monosodium Glutamate Anaphylaxis   Fentanyl    Heart rate dropped to 30 beats per minute and caused convulsions.   Enoxaparin Rash      Medication List    TAKE these medications   aspirin 81 MG tablet Take 81 mg by mouth daily.   calcium-vitamin D 500-200 MG-UNIT tablet Commonly known as:  OSCAL WITH D Take 1 tablet by mouth daily with breakfast. Reported on 10/13/2015   EPINEPHrine 0.3 mg/0.3 mL Soaj injection Commonly known as:  EPI-PEN Inject 0.3 mg into the muscle once.   Magnesium 250 MG Tabs Take 1 tablet by mouth daily.   MULTI-VITAMINS Tabs Take 1 tablet by mouth daily.   oxyCODONE 5 MG immediate release tablet Commonly known as:  Oxy IR/ROXICODONE Take 1-2 tablets (5-10 mg total) by mouth every 4 (four) hours as needed for breakthrough pain.   pantoprazole 40 MG tablet Commonly known as:  PROTONIX Take 40 mg by mouth daily as needed. Reported on 10/13/2015   Potassium Gluconate 595 MG Caps Take 1 capsule by mouth daily.   pyridoxine 100 MG tablet Commonly known as:  B-6 Take 100 mg by mouth daily.   Vitamin D3 2000 units capsule Take 2,000 Units by mouth daily.   ZANAFLEX 4 MG capsule Generic drug:  tiZANidine Take 4 mg by mouth at bedtime.            Durable Medical Equipment        Start     Ordered   06/07/16 1953  DME Walker rolling  Once    Question:  Patient needs a walker to treat with the following condition  Answer:  Status post total right knee replacement using cement   06/07/16 1952   06/07/16 1953  DME Bedside commode  Once    Question:  Patient needs a bedside commode to treat with the following condition  Answer:  Status post total right knee replacement using cement   06/07/16 1952   06/07/16 1953  DME 3 n 1  Once     06/07/16 1952      Diagnostic Studies: Dg Chest 2 View  Result Date: 06/09/2016 CLINICAL DATA:  Fever.  Recent knee replacement surgery EXAM: CHEST  2 VIEW COMPARISON:  April 06, 2016 FINDINGS: There is mild  eventration of the right hemidiaphragm, stable. There is no edema or consolidation. Heart is upper normal in size with pulmonary vascularity within normal limits. No adenopathy. No bone lesions. Previously noted small hiatal hernia is not appreciable on this current examination. IMPRESSION: Right hemidiaphragm eventration.  No edema or consolidation. Electronically Signed   By: Lowella Grip III M.D.   On: 06/09/2016 11:20   Dg Knee Complete 4 Views Right  Result Date: 06/04/2016 CLINICAL DATA:  Patient status post fall from stool. Unable to bear weight on the right knee. Generalized knee pain. EXAM: RIGHT  KNEE - COMPLETE 4+ VIEW COMPARISON:  Knee radiograph 04/07/2016; 04/06/2016; 12/17/2015. FINDINGS: Patient with medial hemiarthroplasty. There is an abnormal appearance of the hardware with apparent anterior migration of the radiolucent spacer device. There appears to be metal on metal contact of the arthroplasty hardware. On the oblique view there is a fracture fragment which appears similar to prior exam and it is unclear if this is related to prior injury or current dislocation. Moderate joint effusion. IMPRESSION: Findings most compatible with anterior and lateral displacement of the radiolucent spacer device of the hemiarthroplasty. On the oblique view there is a fracture fragment which may be related to prior injury or current dislocation. Electronically Signed   By: Lovey Newcomer M.D.   On: 06/04/2016 17:30   Dg Knee Right Port  Result Date: 06/07/2016 CLINICAL DATA:  Initial evaluation status post right total knee arthroplasty. EXAM: PORTABLE RIGHT KNEE - 1-2 VIEW COMPARISON:  Prior radiograph from 06/04/2016. FINDINGS: Postoperative changes from recent ORIF for a a right total knee arthroplasty seen. Femoral and acetabular components appear well seated and normally positioned. No periprosthetic fracture or other complication. Postoperative swelling with soft tissue emphysema about the right  knee. Skin staples remain in place. IMPRESSION: Postoperative changes from recent ORIF for right total knee arthroplasty. No complication. Electronically Signed   By: Jeannine Boga M.D.   On: 06/07/2016 22:23   Disposition: Plan will be for discharge today with HHPT following PT.  Follow-up Information    Judson Roch, PA-C Follow up on 07/01/2016.   Specialty:  Physician Assistant Why:  Levert Feinstein Removal Contact information: [place] [address] [phone]            Signed: Judson Roch PA-C 06/10/2016, 7:50 AM

## 2016-06-10 NOTE — Care Management (Signed)
Primary nurse spoke with Cameron Proud, PA who gave verbal order for patient to be on Lovenox. [facility]

## 2016-06-10 NOTE — Discharge Instructions (Signed)

## 2016-06-13 DIAGNOSIS — M9711XD Periprosthetic fracture around internal prosthetic right knee joint, subsequent encounter: Secondary | ICD-10-CM | POA: Diagnosis not present

## 2016-06-13 DIAGNOSIS — Z96651 Presence of right artificial knee joint: Secondary | ICD-10-CM | POA: Diagnosis not present

## 2016-06-13 DIAGNOSIS — M5116 Intervertebral disc disorders with radiculopathy, lumbar region: Secondary | ICD-10-CM | POA: Diagnosis not present

## 2016-06-13 DIAGNOSIS — S8291XD Unspecified fracture of right lower leg, subsequent encounter for closed fracture with routine healing: Secondary | ICD-10-CM | POA: Diagnosis not present

## 2016-06-13 DIAGNOSIS — M48061 Spinal stenosis, lumbar region without neurogenic claudication: Secondary | ICD-10-CM | POA: Diagnosis not present

## 2016-06-13 DIAGNOSIS — M1990 Unspecified osteoarthritis, unspecified site: Secondary | ICD-10-CM | POA: Diagnosis not present

## 2016-06-14 LAB — CULTURE, BLOOD (ROUTINE X 2)
CULTURE: NO GROWTH
Culture: NO GROWTH

## 2016-06-15 DIAGNOSIS — M48061 Spinal stenosis, lumbar region without neurogenic claudication: Secondary | ICD-10-CM | POA: Diagnosis not present

## 2016-06-15 DIAGNOSIS — M1990 Unspecified osteoarthritis, unspecified site: Secondary | ICD-10-CM | POA: Diagnosis not present

## 2016-06-15 DIAGNOSIS — M5116 Intervertebral disc disorders with radiculopathy, lumbar region: Secondary | ICD-10-CM | POA: Diagnosis not present

## 2016-06-15 DIAGNOSIS — S8291XD Unspecified fracture of right lower leg, subsequent encounter for closed fracture with routine healing: Secondary | ICD-10-CM | POA: Diagnosis not present

## 2016-06-15 DIAGNOSIS — M9711XD Periprosthetic fracture around internal prosthetic right knee joint, subsequent encounter: Secondary | ICD-10-CM | POA: Diagnosis not present

## 2016-06-15 DIAGNOSIS — Z96651 Presence of right artificial knee joint: Secondary | ICD-10-CM | POA: Diagnosis not present

## 2016-06-17 DIAGNOSIS — M1990 Unspecified osteoarthritis, unspecified site: Secondary | ICD-10-CM | POA: Diagnosis not present

## 2016-06-17 DIAGNOSIS — M5116 Intervertebral disc disorders with radiculopathy, lumbar region: Secondary | ICD-10-CM | POA: Diagnosis not present

## 2016-06-17 DIAGNOSIS — M9711XD Periprosthetic fracture around internal prosthetic right knee joint, subsequent encounter: Secondary | ICD-10-CM | POA: Diagnosis not present

## 2016-06-17 DIAGNOSIS — Z96651 Presence of right artificial knee joint: Secondary | ICD-10-CM | POA: Diagnosis not present

## 2016-06-17 DIAGNOSIS — M48061 Spinal stenosis, lumbar region without neurogenic claudication: Secondary | ICD-10-CM | POA: Diagnosis not present

## 2016-06-17 DIAGNOSIS — S8291XD Unspecified fracture of right lower leg, subsequent encounter for closed fracture with routine healing: Secondary | ICD-10-CM | POA: Diagnosis not present

## 2016-06-20 DIAGNOSIS — M1990 Unspecified osteoarthritis, unspecified site: Secondary | ICD-10-CM | POA: Diagnosis not present

## 2016-06-20 DIAGNOSIS — Z96651 Presence of right artificial knee joint: Secondary | ICD-10-CM | POA: Diagnosis not present

## 2016-06-20 DIAGNOSIS — M48061 Spinal stenosis, lumbar region without neurogenic claudication: Secondary | ICD-10-CM | POA: Diagnosis not present

## 2016-06-20 DIAGNOSIS — S8291XD Unspecified fracture of right lower leg, subsequent encounter for closed fracture with routine healing: Secondary | ICD-10-CM | POA: Diagnosis not present

## 2016-06-20 DIAGNOSIS — M9711XD Periprosthetic fracture around internal prosthetic right knee joint, subsequent encounter: Secondary | ICD-10-CM | POA: Diagnosis not present

## 2016-06-20 DIAGNOSIS — M5116 Intervertebral disc disorders with radiculopathy, lumbar region: Secondary | ICD-10-CM | POA: Diagnosis not present

## 2016-06-22 ENCOUNTER — Ambulatory Visit: Payer: Medicare Other | Admitting: Podiatry

## 2016-06-24 DIAGNOSIS — M48061 Spinal stenosis, lumbar region without neurogenic claudication: Secondary | ICD-10-CM | POA: Diagnosis not present

## 2016-06-24 DIAGNOSIS — M9711XD Periprosthetic fracture around internal prosthetic right knee joint, subsequent encounter: Secondary | ICD-10-CM | POA: Diagnosis not present

## 2016-06-24 DIAGNOSIS — Z96651 Presence of right artificial knee joint: Secondary | ICD-10-CM | POA: Diagnosis not present

## 2016-06-24 DIAGNOSIS — M1990 Unspecified osteoarthritis, unspecified site: Secondary | ICD-10-CM | POA: Diagnosis not present

## 2016-06-24 DIAGNOSIS — M5116 Intervertebral disc disorders with radiculopathy, lumbar region: Secondary | ICD-10-CM | POA: Diagnosis not present

## 2016-06-24 DIAGNOSIS — S8291XD Unspecified fracture of right lower leg, subsequent encounter for closed fracture with routine healing: Secondary | ICD-10-CM | POA: Diagnosis not present

## 2016-06-27 DIAGNOSIS — M25561 Pain in right knee: Secondary | ICD-10-CM | POA: Diagnosis not present

## 2016-06-27 DIAGNOSIS — R29898 Other symptoms and signs involving the musculoskeletal system: Secondary | ICD-10-CM | POA: Diagnosis not present

## 2016-06-27 DIAGNOSIS — Z96651 Presence of right artificial knee joint: Secondary | ICD-10-CM | POA: Diagnosis not present

## 2016-06-27 DIAGNOSIS — M25661 Stiffness of right knee, not elsewhere classified: Secondary | ICD-10-CM | POA: Diagnosis not present

## 2016-06-29 DIAGNOSIS — M25661 Stiffness of right knee, not elsewhere classified: Secondary | ICD-10-CM | POA: Diagnosis not present

## 2016-06-29 DIAGNOSIS — R29898 Other symptoms and signs involving the musculoskeletal system: Secondary | ICD-10-CM | POA: Diagnosis not present

## 2016-06-29 DIAGNOSIS — M25561 Pain in right knee: Secondary | ICD-10-CM | POA: Diagnosis not present

## 2016-06-29 DIAGNOSIS — Z96651 Presence of right artificial knee joint: Secondary | ICD-10-CM | POA: Diagnosis not present

## 2016-07-01 DIAGNOSIS — M25561 Pain in right knee: Secondary | ICD-10-CM | POA: Diagnosis not present

## 2016-07-04 DIAGNOSIS — M25561 Pain in right knee: Secondary | ICD-10-CM | POA: Diagnosis not present

## 2016-07-06 DIAGNOSIS — M25561 Pain in right knee: Secondary | ICD-10-CM | POA: Diagnosis not present

## 2016-07-08 DIAGNOSIS — M25561 Pain in right knee: Secondary | ICD-10-CM | POA: Diagnosis not present

## 2016-07-11 DIAGNOSIS — M25661 Stiffness of right knee, not elsewhere classified: Secondary | ICD-10-CM | POA: Diagnosis not present

## 2016-07-13 DIAGNOSIS — M25661 Stiffness of right knee, not elsewhere classified: Secondary | ICD-10-CM | POA: Diagnosis not present

## 2016-07-15 DIAGNOSIS — M25661 Stiffness of right knee, not elsewhere classified: Secondary | ICD-10-CM | POA: Diagnosis not present

## 2016-07-15 DIAGNOSIS — Z96651 Presence of right artificial knee joint: Secondary | ICD-10-CM | POA: Diagnosis not present

## 2016-07-15 DIAGNOSIS — M25561 Pain in right knee: Secondary | ICD-10-CM | POA: Diagnosis not present

## 2016-07-15 DIAGNOSIS — R29898 Other symptoms and signs involving the musculoskeletal system: Secondary | ICD-10-CM | POA: Diagnosis not present

## 2016-07-18 DIAGNOSIS — M25561 Pain in right knee: Secondary | ICD-10-CM | POA: Diagnosis not present

## 2016-07-18 DIAGNOSIS — M25661 Stiffness of right knee, not elsewhere classified: Secondary | ICD-10-CM | POA: Diagnosis not present

## 2016-07-18 DIAGNOSIS — Z96651 Presence of right artificial knee joint: Secondary | ICD-10-CM | POA: Diagnosis not present

## 2016-07-18 DIAGNOSIS — R29898 Other symptoms and signs involving the musculoskeletal system: Secondary | ICD-10-CM | POA: Diagnosis not present

## 2016-07-20 DIAGNOSIS — M25661 Stiffness of right knee, not elsewhere classified: Secondary | ICD-10-CM | POA: Diagnosis not present

## 2016-07-20 DIAGNOSIS — Z96651 Presence of right artificial knee joint: Secondary | ICD-10-CM | POA: Diagnosis not present

## 2016-07-20 DIAGNOSIS — R29898 Other symptoms and signs involving the musculoskeletal system: Secondary | ICD-10-CM | POA: Diagnosis not present

## 2016-07-20 DIAGNOSIS — M25561 Pain in right knee: Secondary | ICD-10-CM | POA: Diagnosis not present

## 2016-07-22 DIAGNOSIS — M25661 Stiffness of right knee, not elsewhere classified: Secondary | ICD-10-CM | POA: Diagnosis not present

## 2016-07-22 DIAGNOSIS — Z96651 Presence of right artificial knee joint: Secondary | ICD-10-CM | POA: Diagnosis not present

## 2016-07-22 DIAGNOSIS — R29898 Other symptoms and signs involving the musculoskeletal system: Secondary | ICD-10-CM | POA: Diagnosis not present

## 2016-07-22 DIAGNOSIS — M25561 Pain in right knee: Secondary | ICD-10-CM | POA: Diagnosis not present

## 2016-07-25 DIAGNOSIS — Z96651 Presence of right artificial knee joint: Secondary | ICD-10-CM | POA: Diagnosis not present

## 2016-07-26 DIAGNOSIS — M25661 Stiffness of right knee, not elsewhere classified: Secondary | ICD-10-CM | POA: Diagnosis not present

## 2016-07-29 DIAGNOSIS — M25661 Stiffness of right knee, not elsewhere classified: Secondary | ICD-10-CM | POA: Diagnosis not present

## 2016-08-02 DIAGNOSIS — M25561 Pain in right knee: Secondary | ICD-10-CM | POA: Diagnosis not present

## 2016-08-02 DIAGNOSIS — Z96651 Presence of right artificial knee joint: Secondary | ICD-10-CM | POA: Diagnosis not present

## 2016-08-02 DIAGNOSIS — M25661 Stiffness of right knee, not elsewhere classified: Secondary | ICD-10-CM | POA: Diagnosis not present

## 2016-08-02 DIAGNOSIS — R29898 Other symptoms and signs involving the musculoskeletal system: Secondary | ICD-10-CM | POA: Diagnosis not present

## 2016-08-08 DIAGNOSIS — M25661 Stiffness of right knee, not elsewhere classified: Secondary | ICD-10-CM | POA: Diagnosis not present

## 2016-08-22 DIAGNOSIS — M9711XD Periprosthetic fracture around internal prosthetic right knee joint, subsequent encounter: Secondary | ICD-10-CM | POA: Diagnosis not present

## 2016-10-03 NOTE — Progress Notes (Signed)
10/04/2016 9:00 AM   Alexander Woods May 20, 1946 [id number]  Referring provider: Glendon Axe, MD [place] [facility] [place], [place] [address]  Chief Complaint  Patient presents with  . Prostate Cancer    1 year follow up  . Benign Prostatic Hypertrophy    HPI: Patient is a 71 year old Caucasian male with prostate cancer and BPH with LUTS who presents today for a yearly follow up.  History of prostate cancer Patient underwent TURP on 01/17/2007 by Dr. Tana Conch Mynatt at [facility] and 2 chips returned with focal adenocarcinoma, Gleason 2 + 3=5. Most recent PSA was 2.1 ng/mL on 10/17/2014.     BPH WITH LUTS His IPSS score today is 2, which is mild lower urinary tract symptomatology. He is delighted with his quality life due to his urinary symptoms.  His previous I PSS was 2/0.  His major complaints today are frequency and nocturia.  He denies any dysuria, hematuria or suprapubic pain.  His has had TURP on 01/17/2007.   He also denies any recent fevers, chills, nausea or vomiting.  He does not have a family history of PCa.      IPSS    Row Name 10/04/16 0800         International Prostate Symptom Score   How often have you had the sensation of not emptying your bladder? Not at All     How often have you had to urinate less than every two hours? Less than 1 in 5 times     How often have you found you stopped and started again several times when you urinated? Not at All     How often have you found it difficult to postpone urination? Not at All     How often have you had a weak urinary stream? Not at All     How often have you had to strain to start urination? Not at All     How many times did you typically get up at night to urinate? 1 Time     Total IPSS Score 2       Quality of Life due to urinary symptoms   If you were to spend the rest of your life with your urinary condition just the way it is now how would you feel about that?  Delighted        Score:  1-7 Mild 8-19 Moderate 20-35 Severe   Erectile dysfunction Patient is requesting a sample of Viagra.   PMH: Past Medical History:  Diagnosis Date  . Arthritis   . BPH with obstruction/lower urinary tract symptoms   . Cancer ([place])   . Cataract   . DDD (degenerative disc disease), lumbar   . DJD (degenerative joint disease)   . GERD (gastroesophageal reflux disease)   . Heartburn   . HH (hiatus hernia)   . Hypogonadism in male   . Leg cramps   . Prostate cancer ([place])   . Sleep apnea    patient denies    Surgical History: Past Surgical History:  Procedure Laterality Date  . [place] STUDY N/A 04/15/2015   Procedure: [place] STUDY;  Surgeon: Josefine Class, MD;  Location: [facility] ENDOSCOPY;  Service: Endoscopy;  Laterality: N/A;  . APPENDECTOMY    . bLERETHEPLASTY    . ESOPHAGEAL MANOMETRY N/A 04/15/2015   Procedure: ESOPHAGEAL MANOMETRY (EM);  Surgeon: Josefine Class, MD;  Location: [facility] ENDOSCOPY;  Service: Endoscopy;  Laterality: N/A;  . ESOPHAGOGASTRODUODENOSCOPY (EGD) WITH PROPOFOL N/A 02/12/2015   Procedure: ESOPHAGOGASTRODUODENOSCOPY (EGD) WITH PROPOFOL;  Surgeon: Josefine Class, MD;  Location: [facility] ENDOSCOPY;  Service: Endoscopy;  Laterality: N/A;  . INCISION TENDON SHEATH FOR TRIGGER FINGER    . PARTIAL KNEE ARTHROPLASTY Right 12/17/2015   Procedure: UNICOMPARTMENTAL KNEE;  Surgeon: Corky Mull, MD;  Location: [facility] ORS;  Service: Orthopedics;  Laterality: Right;  . PARTIAL KNEE ARTHROPLASTY Left 04/07/2016   Procedure: UNICOMPARTMENTAL KNEE/ REVISION POLYETHYLENE;  Surgeon: Corky Mull, MD;  Location: [facility] ORS;  Service: Orthopedics;  Laterality: Left;  . PLANTAR FASCITIS     . PROSTATE SURGERY    . TONSILLECTOMY    . TOTAL KNEE REVISION Right 06/07/2016   Procedure: TOTAL KNEE REVISION;  Surgeon: Corky Mull, MD;  Location: [facility] ORS;  Service: Orthopedics;  Laterality: Right;  . TURP VAPORIZATION      Home Medications:   Allergies as of 10/04/2016      Reactions   Bacitracin Anaphylaxis   Bee Venom Anaphylaxis   Ciprofloxacin Anaphylaxis   Erythromycin Ethylsuccinate Anaphylaxis   Monosodium Glutamate Anaphylaxis   Fentanyl    Heart rate dropped to 30 beats per minute and caused convulsions.   Enoxaparin Rash      Medication List       Accurate as of 10/04/16  9:00 AM. Always use your most recent med list.          aspirin 81 MG tablet Take 81 mg by mouth daily.   calcium-vitamin D 500-200 MG-UNIT tablet Commonly known as:  OSCAL WITH D Take 1 tablet by mouth daily with breakfast. Reported on 10/13/2015   EPINEPHrine 0.3 mg/0.3 mL Soaj injection Commonly known as:  EPI-PEN Inject 0.3 mg into the muscle once.   Magnesium 250 MG Tabs Take 1 tablet by mouth daily.   metoprolol succinate 25 MG 24 hr tablet Commonly known as:  TOPROL-XL Take by mouth.   MULTI-VITAMINS Tabs Take 1 tablet by mouth daily.   oxyCODONE 5 MG immediate release tablet Commonly known as:  Oxy IR/ROXICODONE Take 1-2 tablets (5-10 mg total) by mouth every 4 (four) hours as needed for breakthrough pain.   pantoprazole 40 MG tablet Commonly known as:  PROTONIX Take 40 mg by mouth daily as needed. Reported on 10/13/2015   Potassium 99 MG Tabs Take by mouth.   Potassium Gluconate 595 MG Caps Take 1 capsule by mouth daily.   pyridoxine 100 MG tablet Commonly known as:  B-6 Take 100 mg by mouth daily.   UNABLE TO FIND Med Name: Cinsulin   vitamin B-12 1000 MCG tablet Commonly known as:  CYANOCOBALAMIN Take by mouth.   vitamin C 1000 MG tablet Take by mouth.   Vitamin D3 2000 units capsule Take 2,000 Units by mouth daily.   ZANAFLEX 4 MG capsule Generic drug:  tiZANidine Take 4 mg by mouth at bedtime.       Allergies:  Allergies  Allergen Reactions  . Bacitracin Anaphylaxis  . Bee Venom Anaphylaxis  . Ciprofloxacin Anaphylaxis  . Erythromycin Ethylsuccinate Anaphylaxis  . Monosodium Glutamate  Anaphylaxis  . Fentanyl     Heart rate dropped to 30 beats per minute and caused convulsions.  . Enoxaparin Rash    Family History: Family History  Problem Relation Age of Onset  . Stroke Father   . Heart disease Mother     Aortic Tear  . Heart failure Mother   . Kidney disease Neg Hx   .  Prostate cancer Neg Hx   . Bladder Cancer Neg Hx     Social History:  reports that he has never smoked. He has never used smokeless tobacco. He reports that he does not drink alcohol or use drugs.  ROS: UROLOGY Frequent Urination?: No Hard to postpone urination?: No Burning/pain with urination?: No Get up at night to urinate?: No Leakage of urine?: No Urine stream starts and stops?: No Trouble starting stream?: No Do you have to strain to urinate?: No Blood in urine?: No Urinary tract infection?: No Sexually transmitted disease?: No Injury to kidneys or bladder?: No Painful intercourse?: No Weak stream?: No Erection problems?: Yes Penile pain?: No  Gastrointestinal Nausea?: No Vomiting?: No Indigestion/heartburn?: No Diarrhea?: No Constipation?: No  Constitutional Fever: No Night sweats?: No Weight loss?: No Fatigue?: No  Skin Skin rash/lesions?: No Itching?: No  Eyes Blurred vision?: No Double vision?: No  Ears/Nose/Throat Sore throat?: No Sinus problems?: No  Hematologic/Lymphatic Swollen glands?: No Easy bruising?: No  Cardiovascular Leg swelling?: No Chest pain?: No  Respiratory Cough?: No Shortness of breath?: No  Endocrine Excessive thirst?: No  Musculoskeletal Back pain?: No Joint pain?: No  Neurological Headaches?: No Dizziness?: No  Psychologic Depression?: No Anxiety?: No  Physical Exam: BP 131/89   Pulse 84   Ht 5\' 11"  (1.803 m)   Wt 203 lb 12.8 oz (92.4 kg)   BMI 28.42 kg/m   Constitutional: Well nourished. Alert and oriented, No acute distress. HEENT: [place] AT, moist mucus membranes. Trachea midline, no  masses. Cardiovascular: No clubbing, cyanosis, or edema. Respiratory: Normal respiratory effort, no increased work of breathing. GI: Abdomen is soft, non tender, non distended, no abdominal masses. Liver and spleen not palpable.  No hernias appreciated.  Stool sample for occult testing is not indicated.   GU: No CVA tenderness.  No bladder fullness or masses.  Patient with circumcised phallus.  Urethral meatus is patent.  No penile discharge. No penile lesions or rashes. Scrotum without lesions, cysts, rashes and/or edema.  Testicles are located scrotally bilaterally. No masses are appreciated in the testicles. Left and right epididymis are normal. Rectal: Patient with  normal sphincter tone. Anus and perineum without scarring or rashes. No rectal masses are appreciated. Prostate is approximately 50 grams, no nodules are appreciated. Seminal vesicles are normal. Skin: No rashes, bruises or suspicious lesions. Lymph: No cervical or inguinal adenopathy. Neurologic: Grossly intact, no focal deficits, moving all 4 extremities. Psychiatric: Normal mood and affect.  Laboratory Data: PSA History:  6.5 ng/mL on 05/26/2005  5.3 ng/mL on 06/16/2005  3.7 ng/mL on 12/06/2006  1.2 ng/mL on 05/02/2007  1.6 ng/mL on 10/31/2007  1.3 ng/mL on 10/22/2008  1.6 ng/mL on 04/22/2009  1.8 ng/mL on 10/28/2009  1.8 ng/mL on 05/05/2010  1.8 ng/mL on 12/22/2011  1.8 ng/mL on 12/20/2012  2.0 ng/mL on 10/16/2013  2.1 ng/mL on 10/17/2014  2.3 ng/mL on 10/13/2015  Assessment & Plan:    1. History of prostate cancer:   Patient underwent TURP on 01/17/2007 by Dr. Tana Conch Mynatt at [facility] and 2 chips returned with focal adenocarcinoma, Gleason 2 + 3=5. Most recent PSA was 2.1 ng/mL on 10/17/2014.  If PSA remains stable, we will see him in one year.   - PSA  2. BPH with LUTS:    IPSS score is 2/0.  We will continue to monitor.  He will have his IPSS score, exam and PSA in 12 months if PSA remains  stable.   3.  Erectile dysfunction  - script given for sildenafil 20 mg  - RTC in one year for SHIM and exam   Return in about 1 year (around 10/04/2017) for IPSS, SHIM, PSA and exam.  These notes generated with voice recognition software. I apologize for typographical errors.  Zara Council, [facility] [address], [place], [place] [address] [phone]

## 2016-10-04 ENCOUNTER — Ambulatory Visit (INDEPENDENT_AMBULATORY_CARE_PROVIDER_SITE_OTHER): Payer: Medicare Other | Admitting: Urology

## 2016-10-04 ENCOUNTER — Encounter: Payer: Self-pay | Admitting: Urology

## 2016-10-04 VITALS — BP 131/89 | HR 84 | Ht 71.0 in | Wt 203.8 lb

## 2016-10-04 DIAGNOSIS — N138 Other obstructive and reflux uropathy: Secondary | ICD-10-CM | POA: Diagnosis not present

## 2016-10-04 DIAGNOSIS — N401 Enlarged prostate with lower urinary tract symptoms: Secondary | ICD-10-CM

## 2016-10-04 DIAGNOSIS — N529 Male erectile dysfunction, unspecified: Secondary | ICD-10-CM

## 2016-10-04 DIAGNOSIS — Z8546 Personal history of malignant neoplasm of prostate: Secondary | ICD-10-CM

## 2016-10-04 MED ORDER — SILDENAFIL CITRATE 20 MG PO TABS: ORAL_TABLET | ORAL | 3 refills | Status: DC

## 2016-10-05 ENCOUNTER — Telehealth: Payer: Self-pay

## 2016-10-05 LAB — PSA: Prostate Specific Ag, Serum: 1.8 ng/mL (ref 0.0–4.0)

## 2016-10-05 NOTE — Telephone Encounter (Signed)
-----   Message from Nori Riis, PA-C sent at 10/05/2016  8:10 AM EDT ----- Patient's PSA is stable at 1.8.   We will see him in 12 months.  PSA to be drawn before his next appointment.

## 2016-10-05 NOTE — Telephone Encounter (Signed)
Spoke with pt in reference to PSA results. Pt voiced understanding.  

## 2016-10-12 ENCOUNTER — Telehealth: Payer: Self-pay

## 2016-10-12 NOTE — Telephone Encounter (Signed)
PA for sildenafil has been DENIED!

## 2016-11-07 DIAGNOSIS — Z96651 Presence of right artificial knee joint: Secondary | ICD-10-CM | POA: Diagnosis not present

## 2016-11-10 DIAGNOSIS — Z13220 Encounter for screening for lipoid disorders: Secondary | ICD-10-CM | POA: Diagnosis not present

## 2016-11-10 DIAGNOSIS — K219 Gastro-esophageal reflux disease without esophagitis: Secondary | ICD-10-CM | POA: Diagnosis not present

## 2016-11-10 DIAGNOSIS — Z131 Encounter for screening for diabetes mellitus: Secondary | ICD-10-CM | POA: Diagnosis not present

## 2016-11-10 DIAGNOSIS — Z1329 Encounter for screening for other suspected endocrine disorder: Secondary | ICD-10-CM | POA: Diagnosis not present

## 2016-11-17 DIAGNOSIS — Z23 Encounter for immunization: Secondary | ICD-10-CM | POA: Diagnosis not present

## 2016-11-17 DIAGNOSIS — Z0000 Encounter for general adult medical examination without abnormal findings: Secondary | ICD-10-CM | POA: Diagnosis not present

## 2016-11-17 DIAGNOSIS — R7303 Prediabetes: Secondary | ICD-10-CM | POA: Diagnosis not present

## 2016-11-17 DIAGNOSIS — K449 Diaphragmatic hernia without obstruction or gangrene: Secondary | ICD-10-CM | POA: Diagnosis not present

## 2016-11-17 DIAGNOSIS — D500 Iron deficiency anemia secondary to blood loss (chronic): Secondary | ICD-10-CM | POA: Diagnosis not present

## 2016-11-17 DIAGNOSIS — K219 Gastro-esophageal reflux disease without esophagitis: Secondary | ICD-10-CM | POA: Diagnosis not present

## 2016-11-18 DIAGNOSIS — Z23 Encounter for immunization: Secondary | ICD-10-CM | POA: Diagnosis not present

## 2016-11-21 DIAGNOSIS — D500 Iron deficiency anemia secondary to blood loss (chronic): Secondary | ICD-10-CM | POA: Diagnosis not present

## 2016-12-02 DIAGNOSIS — M9711XD Periprosthetic fracture around internal prosthetic right knee joint, subsequent encounter: Secondary | ICD-10-CM | POA: Diagnosis not present

## 2016-12-02 DIAGNOSIS — S8291XD Unspecified fracture of right lower leg, subsequent encounter for closed fracture with routine healing: Secondary | ICD-10-CM | POA: Diagnosis not present

## 2017-01-20 DIAGNOSIS — H1859 Other hereditary corneal dystrophies: Secondary | ICD-10-CM | POA: Diagnosis not present

## 2017-01-24 DIAGNOSIS — H18831 Recurrent erosion of cornea, right eye: Secondary | ICD-10-CM | POA: Diagnosis not present

## 2017-01-31 DIAGNOSIS — H1859 Other hereditary corneal dystrophies: Secondary | ICD-10-CM | POA: Diagnosis not present

## 2017-03-02 DIAGNOSIS — H18831 Recurrent erosion of cornea, right eye: Secondary | ICD-10-CM | POA: Diagnosis not present

## 2017-03-07 DIAGNOSIS — D500 Iron deficiency anemia secondary to blood loss (chronic): Secondary | ICD-10-CM | POA: Diagnosis not present

## 2017-03-08 DIAGNOSIS — Z23 Encounter for immunization: Secondary | ICD-10-CM | POA: Diagnosis not present

## 2017-03-14 DIAGNOSIS — K219 Gastro-esophageal reflux disease without esophagitis: Secondary | ICD-10-CM | POA: Diagnosis not present

## 2017-03-14 DIAGNOSIS — R7303 Prediabetes: Secondary | ICD-10-CM | POA: Diagnosis not present

## 2017-03-14 DIAGNOSIS — K449 Diaphragmatic hernia without obstruction or gangrene: Secondary | ICD-10-CM | POA: Diagnosis not present

## 2017-03-14 DIAGNOSIS — Z125 Encounter for screening for malignant neoplasm of prostate: Secondary | ICD-10-CM | POA: Diagnosis not present

## 2017-06-02 DIAGNOSIS — H2513 Age-related nuclear cataract, bilateral: Secondary | ICD-10-CM | POA: Diagnosis not present

## 2017-09-29 ENCOUNTER — Other Ambulatory Visit: Payer: Self-pay

## 2017-09-29 ENCOUNTER — Other Ambulatory Visit: Payer: Medicare Other

## 2017-09-29 DIAGNOSIS — R972 Elevated prostate specific antigen [PSA]: Secondary | ICD-10-CM

## 2017-09-30 LAB — PSA: PROSTATE SPECIFIC AG, SERUM: 1.9 ng/mL (ref 0.0–4.0)

## 2017-10-02 ENCOUNTER — Other Ambulatory Visit: Payer: Medicare Other

## 2017-10-03 ENCOUNTER — Ambulatory Visit: Payer: Medicare Other | Admitting: Urology

## 2017-10-06 ENCOUNTER — Telehealth: Payer: Self-pay | Admitting: Urology

## 2017-10-06 NOTE — Telephone Encounter (Signed)
His PSA is 1.9.  His PSA prior to this was 1.8.  So there is not much change.  His MyChart is active.  Is he able to see his PSA results on his MyChart?

## 2017-10-06 NOTE — Telephone Encounter (Signed)
Were you able to get a hold of him and let him know his PSA results?

## 2017-10-06 NOTE — Telephone Encounter (Signed)
Yeah that's what we thought, and he is active on mychart.   Thanks,  Sharyn Lull

## 2017-10-06 NOTE — Telephone Encounter (Signed)
We had to push his app out to 11-08-17 and he is wanting to know if he can know what his PSA results are prior to the app? Please advise   Sharyn Lull

## 2017-10-16 NOTE — Telephone Encounter (Signed)
Yes he is aware  Sharyn Lull

## 2017-11-06 NOTE — Progress Notes (Signed)
11/08/2017 8:37 AM   Alexander Woods [birth year] [id number]  Referring provider: Glendon Axe, MD [place] [facility] [place], [place] [address]  Chief Complaint  Patient presents with  . Prostate Cancer    HPI: Patient is a 72 year old Caucasian male with a history of prostate cancer and BPH with LUTS who presents today for a yearly follow up.  History of prostate cancer Patient underwent TURP on 01/17/2007 by Dr. Tana Conch Mynatt at [facility] and 2 chips returned with focal adenocarcinoma, Gleason 2 + 3=5. Most recent PSA was 1.9 ng/mL on 09/29/2017.      BPH WITH LUTS His IPSS score today is 3, which is mild lower urinary tract symptomatology. He is pleased with his quality life due to his urinary symptoms.  His previous I PSS was 2/0.  He has not complaints today.  He denies any dysuria, hematuria or suprapubic pain.  His has had TURP on 01/17/2007.   He also denies any recent fevers, chills, nausea or vomiting.  He does not have a family history of PCa. IPSS    Row Name 11/08/17 0800         International Prostate Symptom Score   How often have you had the sensation of not emptying your bladder?  Not at All     How often have you had to urinate less than every two hours?  Less than 1 in 5 times     How often have you found you stopped and started again several times when you urinated?  Not at All     How often have you found it difficult to postpone urination?  Less than 1 in 5 times     How often have you had a weak urinary stream?  Not at All     How often have you had to strain to start urination?  Not at All     How many times did you typically get up at night to urinate?  1 Time     Total IPSS Score  3       Quality of Life due to urinary symptoms   If you were to spend the rest of your life with your urinary condition just the way it is now how would you feel about that?  Pleased        Score:  1-7 Mild 8-19 Moderate 20-35  Severe   Erectile dysfunction His SHIM score is 5, which is severe ED.   He has been having difficulty with erections for several years.   His major complaint is no erections.  His libido is preserved.   His risk factors for ED are age, BPH and prostate cancer.  He denies any painful erections or curvatures with his erections.   He is no longer having spontaneous erections.  He has tried PDE5i in the past without effect.  SHIM    Row Name 11/08/17 0829         SHIM: Over the last 6 months:   How do you rate your confidence that you could get and keep an erection?  Very Low     When you had erections with sexual stimulation, how often were your erections hard enough for penetration (entering your partner)?  Almost Never or Never     During sexual intercourse, how often were you able to maintain your erection after you had penetrated (entered) your partner?  Almost Never or Never  During sexual intercourse, how difficult was it to maintain your erection to completion of intercourse?  Extremely Difficult     When you attempted sexual intercourse, how often was it satisfactory for you?  Almost Never or Never       SHIM Total Score   SHIM  5        Score: 1-7 Severe ED 8-11 Moderate ED 12-16 Mild-Moderate ED 17-21 Mild ED 22-25 No ED   PMH: Past Medical History:  Diagnosis Date  . Arthritis   . BPH with obstruction/lower urinary tract symptoms   . Cancer ([place])   . Cataract   . DDD (degenerative disc disease), lumbar   . DJD (degenerative joint disease)   . GERD (gastroesophageal reflux disease)   . Heartburn   . HH (hiatus hernia)   . Hypogonadism in male   . Leg cramps   . Prostate cancer ([place])   . Sleep apnea    patient denies    Surgical History: Past Surgical History:  Procedure Laterality Date  . [place] STUDY N/A 04/15/2015   Procedure: [place] STUDY;  Surgeon: Josefine Class, MD;  Location: [facility] ENDOSCOPY;  Service: Endoscopy;  Laterality: N/A;  .  APPENDECTOMY    . bLERETHEPLASTY    . ESOPHAGEAL MANOMETRY N/A 04/15/2015   Procedure: ESOPHAGEAL MANOMETRY (EM);  Surgeon: Josefine Class, MD;  Location: [facility] ENDOSCOPY;  Service: Endoscopy;  Laterality: N/A;  . ESOPHAGOGASTRODUODENOSCOPY (EGD) WITH PROPOFOL N/A 02/12/2015   Procedure: ESOPHAGOGASTRODUODENOSCOPY (EGD) WITH PROPOFOL;  Surgeon: Josefine Class, MD;  Location: [facility] ENDOSCOPY;  Service: Endoscopy;  Laterality: N/A;  . INCISION TENDON SHEATH FOR TRIGGER FINGER    . PARTIAL KNEE ARTHROPLASTY Right 12/17/2015   Procedure: UNICOMPARTMENTAL KNEE;  Surgeon: Corky Mull, MD;  Location: [facility] ORS;  Service: Orthopedics;  Laterality: Right;  . PARTIAL KNEE ARTHROPLASTY Left 04/07/2016   Procedure: UNICOMPARTMENTAL KNEE/ REVISION POLYETHYLENE;  Surgeon: Corky Mull, MD;  Location: [facility] ORS;  Service: Orthopedics;  Laterality: Left;  . PLANTAR FASCITIS     . PROSTATE SURGERY    . TONSILLECTOMY    . TOTAL KNEE REVISION Right 06/07/2016   Procedure: TOTAL KNEE REVISION;  Surgeon: Corky Mull, MD;  Location: [facility] ORS;  Service: Orthopedics;  Laterality: Right;  . TURP VAPORIZATION      Home Medications:  Allergies as of 11/08/2017      Reactions   Bacitracin Anaphylaxis   Bee Venom Anaphylaxis   Ciprofloxacin Anaphylaxis   Erythromycin Ethylsuccinate Anaphylaxis   Monosodium Glutamate Anaphylaxis   Fentanyl    Heart rate dropped to 30 beats per minute and caused convulsions.   Enoxaparin Rash      Medication List        Accurate as of 11/08/17  8:37 AM. Always use your most recent med list.          aspirin 81 MG tablet Take 81 mg by mouth daily.   calcium-vitamin D 500-200 MG-UNIT tablet Commonly known as:  OSCAL WITH D Take 1 tablet by mouth daily with breakfast. Reported on 10/13/2015   EPINEPHrine 0.3 mg/0.3 mL Soaj injection Commonly known as:  EPI-PEN Inject 0.3 mg into the muscle once.   Magnesium 250 MG Tabs Take 1 tablet by mouth daily.   MULTI-VITAMINS  Tabs Take 1 tablet by mouth daily.   pantoprazole 40 MG tablet Commonly known as:  PROTONIX Take 40 mg by mouth daily as needed. Reported on 10/13/2015   Potassium Gluconate  595 MG Caps Take 1 capsule by mouth daily.   pyridoxine 100 MG tablet Commonly known as:  B-6 Take 100 mg by mouth daily.   UNABLE TO FIND Med Name: Cinsulin   vitamin B-12 1000 MCG tablet Commonly known as:  CYANOCOBALAMIN Take by mouth.   vitamin C 1000 MG tablet Take by mouth.   Vitamin D3 2000 units capsule Take 2,000 Units by mouth daily.       Allergies:  Allergies  Allergen Reactions  . Bacitracin Anaphylaxis  . Bee Venom Anaphylaxis  . Ciprofloxacin Anaphylaxis  . Erythromycin Ethylsuccinate Anaphylaxis  . Monosodium Glutamate Anaphylaxis  . Fentanyl     Heart rate dropped to 30 beats per minute and caused convulsions.  . Enoxaparin Rash    Family History: Family History  Problem Relation Age of Onset  . Stroke Father   . Heart disease Mother        Aortic Tear  . Heart failure Mother   . Kidney disease Neg Hx   . Prostate cancer Neg Hx   . Bladder Cancer Neg Hx     Social History:  reports that he has never smoked. He has never used smokeless tobacco. He reports that he does not drink alcohol or use drugs.  ROS: UROLOGY Frequent Urination?: No Hard to postpone urination?: No Burning/pain with urination?: No Get up at night to urinate?: No Leakage of urine?: No Urine stream starts and stops?: No Trouble starting stream?: No Do you have to strain to urinate?: No Blood in urine?: No Urinary tract infection?: No Sexually transmitted disease?: No Injury to kidneys or bladder?: No Painful intercourse?: No Weak stream?: No Erection problems?: Yes Penile pain?: No  Gastrointestinal Nausea?: No Vomiting?: No Indigestion/heartburn?: No Diarrhea?: No Constipation?: No  Constitutional Fever: No Night sweats?: No Weight loss?: No Fatigue?: No  Skin Skin  rash/lesions?: No Itching?: No  Eyes Blurred vision?: No Double vision?: No  Ears/Nose/Throat Sore throat?: No Sinus problems?: No  Hematologic/Lymphatic Swollen glands?: No Easy bruising?: No  Cardiovascular Leg swelling?: No Chest pain?: No  Respiratory Cough?: No Shortness of breath?: No  Endocrine Excessive thirst?: No  Musculoskeletal Back pain?: Yes Joint pain?: No  Neurological Headaches?: No Dizziness?: No  Psychologic Depression?: No Anxiety?: No  Physical Exam: BP (!) 148/94 (BP Location: Right Arm, Patient Position: Sitting, Cuff Size: Large)   Pulse 70   Ht 5\' 11"  (1.803 m)   Wt 212 lb (96.2 kg)   BMI 29.57 kg/m   Constitutional: Well nourished. Alert and oriented, No acute distress. HEENT: [place] AT, moist mucus membranes. Trachea midline, no masses. Cardiovascular: No clubbing, cyanosis, or edema. Respiratory: Normal respiratory effort, no increased work of breathing. GI: Abdomen is soft, non tender, non distended, no abdominal masses. Liver and spleen not palpable.  No hernias appreciated.  Stool sample for occult testing is not indicated.   GU: No CVA tenderness.  No bladder fullness or masses.  Patient with circumcised phallus.    Urethral meatus is patent.  No penile discharge. No penile lesions or rashes. Scrotum without lesions, cysts, rashes and/or edema.  Testicles are located scrotally bilaterally. No masses are appreciated in the testicles. Left and right epididymis are normal. Rectal: Patient with  normal sphincter tone. Anus and perineum without scarring or rashes. No rectal masses are appreciated. Prostate is approximately 50 grams, no nodules are appreciated. Seminal vesicles are normal. Skin: No rashes, bruises or suspicious lesions. Lymph: No cervical or inguinal adenopathy. Neurologic: Grossly intact, no  focal deficits, moving all 4 extremities. Psychiatric: Normal mood and affect.   Laboratory Data: PSA History:  6.5 ng/mL on  05/26/2005  5.3 ng/mL on 06/16/2005  3.7 ng/mL on 12/06/2006  1.2 ng/mL on 05/02/2007  1.6 ng/mL on 10/31/2007  1.3 ng/mL on 10/22/2008  1.6 ng/mL on 04/22/2009  1.8 ng/mL on 10/28/2009  1.8 ng/mL on 05/05/2010  1.8 ng/mL on 12/22/2011  1.8 ng/mL on 12/20/2012  2.0 ng/mL on 10/16/2013  2.1 ng/mL on 10/17/2014  2.3 ng/mL on 10/13/2015  1.8 ng/mL on 10/04/2016  1.9 ng/mL on 09/29/2017  I have reviewed the labs.  Assessment & Plan:    1. History of prostate cancer:   Patient underwent TURP on 01/17/2007 by Dr. Tana Conch Mynatt at [facility] and 2 chips returned with focal adenocarcinoma, Gleason 2 + 3=5. Most recent PSA was 1.9 ng/mL on 09/29/2017.  If PSA remains stable, we will see him in one year.   2. BPH with LUTS:    IPSS score is  3/1.  It is stable.  We will continue to monitor.  He will have his IPSS score, exam and PSA in 12 months if PSA remains stable.   3. Erectile dysfunction Not interested in therapy at this time.  Return in about 1 year (around 11/09/2018) for IPSS, SHIM, PSA and exam.  These notes generated with voice recognition software. I apologize for typographical errors.  Zara Council, PA-C  [facility] [address] [place], [place] [address] [phone]

## 2017-11-08 ENCOUNTER — Ambulatory Visit (INDEPENDENT_AMBULATORY_CARE_PROVIDER_SITE_OTHER): Payer: Medicare Other | Admitting: Urology

## 2017-11-08 ENCOUNTER — Encounter: Payer: Self-pay | Admitting: Urology

## 2017-11-08 VITALS — BP 148/94 | HR 70 | Ht 71.0 in | Wt 212.0 lb

## 2017-11-08 DIAGNOSIS — N138 Other obstructive and reflux uropathy: Secondary | ICD-10-CM

## 2017-11-08 DIAGNOSIS — Z8546 Personal history of malignant neoplasm of prostate: Secondary | ICD-10-CM | POA: Diagnosis not present

## 2017-11-08 DIAGNOSIS — N529 Male erectile dysfunction, unspecified: Secondary | ICD-10-CM | POA: Diagnosis not present

## 2017-11-08 DIAGNOSIS — N401 Enlarged prostate with lower urinary tract symptoms: Secondary | ICD-10-CM | POA: Diagnosis not present

## 2017-11-14 DIAGNOSIS — K449 Diaphragmatic hernia without obstruction or gangrene: Secondary | ICD-10-CM | POA: Diagnosis not present

## 2017-11-14 DIAGNOSIS — K219 Gastro-esophageal reflux disease without esophagitis: Secondary | ICD-10-CM | POA: Diagnosis not present

## 2017-11-14 DIAGNOSIS — R7303 Prediabetes: Secondary | ICD-10-CM | POA: Diagnosis not present

## 2017-11-14 DIAGNOSIS — Z125 Encounter for screening for malignant neoplasm of prostate: Secondary | ICD-10-CM | POA: Diagnosis not present

## 2017-11-21 DIAGNOSIS — K449 Diaphragmatic hernia without obstruction or gangrene: Secondary | ICD-10-CM | POA: Diagnosis not present

## 2017-11-21 DIAGNOSIS — Z0000 Encounter for general adult medical examination without abnormal findings: Secondary | ICD-10-CM | POA: Diagnosis not present

## 2017-11-21 DIAGNOSIS — R7303 Prediabetes: Secondary | ICD-10-CM | POA: Diagnosis not present

## 2017-11-21 DIAGNOSIS — K219 Gastro-esophageal reflux disease without esophagitis: Secondary | ICD-10-CM | POA: Diagnosis not present

## 2017-11-29 DIAGNOSIS — Z0000 Encounter for general adult medical examination without abnormal findings: Secondary | ICD-10-CM | POA: Diagnosis not present

## 2018-01-30 DIAGNOSIS — H903 Sensorineural hearing loss, bilateral: Secondary | ICD-10-CM | POA: Diagnosis not present

## 2018-01-30 DIAGNOSIS — H9319 Tinnitus, unspecified ear: Secondary | ICD-10-CM | POA: Diagnosis not present

## 2018-03-15 DIAGNOSIS — Z23 Encounter for immunization: Secondary | ICD-10-CM | POA: Diagnosis not present

## 2018-05-30 IMAGING — CR DG KNEE COMPLETE 4+V*R*
4 series · 4 of 4 positions shown · non-contrast
Comparison: Knee radiograph 04/07/2016; 04/06/2016; 12/17/2015.

CLINICAL DATA: Patient status post fall from stool. Unable to bear
weight on the right knee. Generalized knee pain.

EXAM:
RIGHT KNEE - COMPLETE 4+ VIEW

[knee ap]
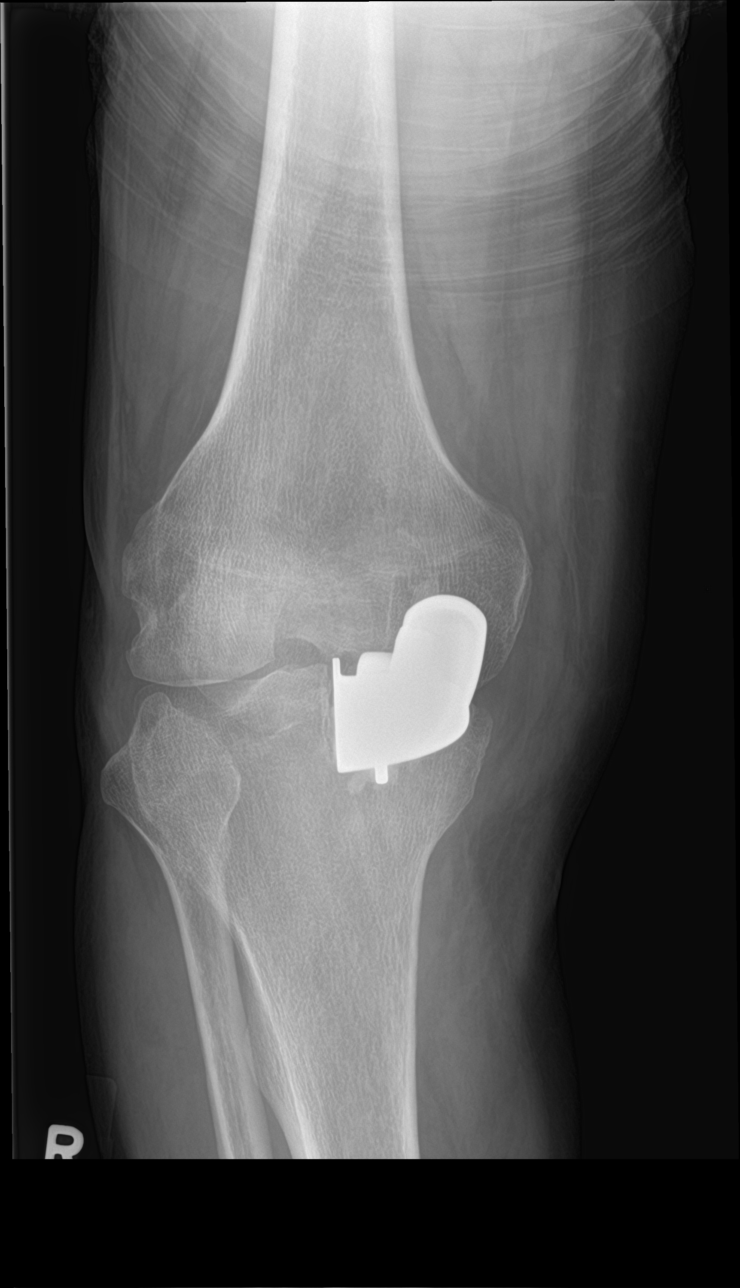

[knee obl (1 of 2)]
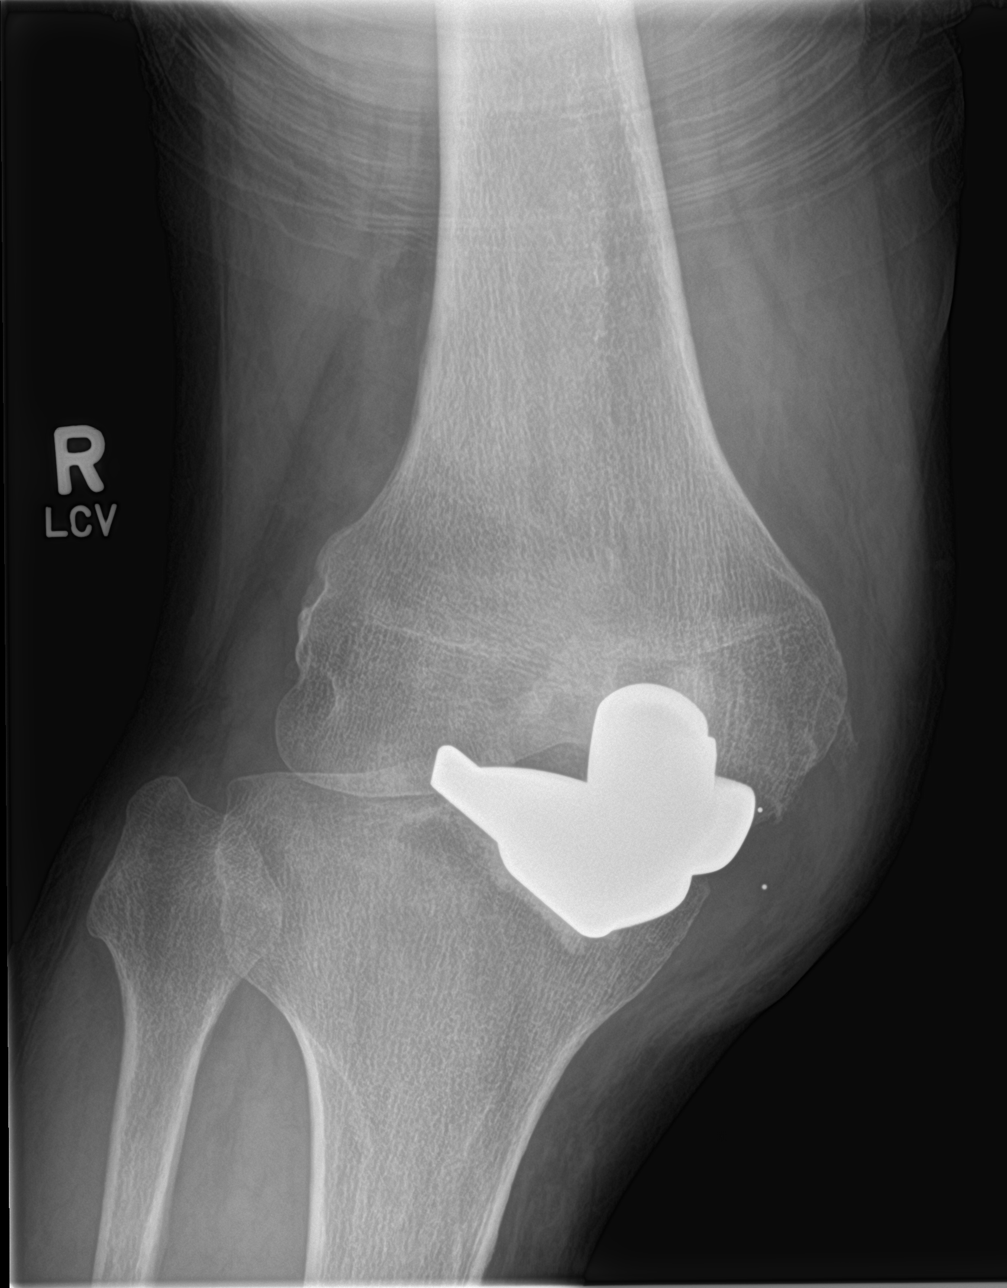

[knee obl (2 of 2)]
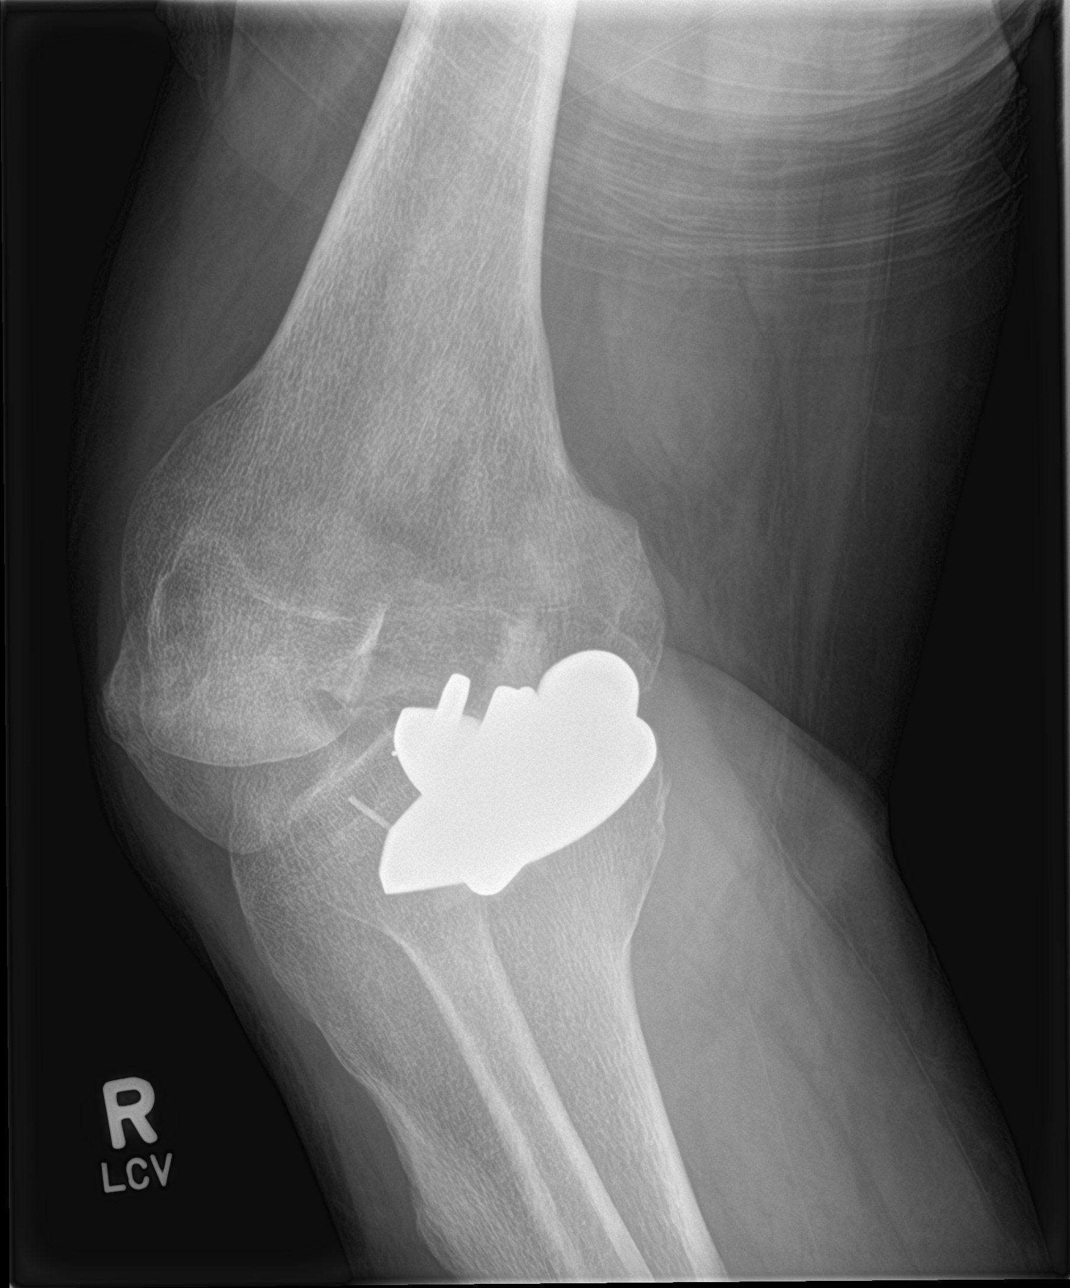

[knee lat]
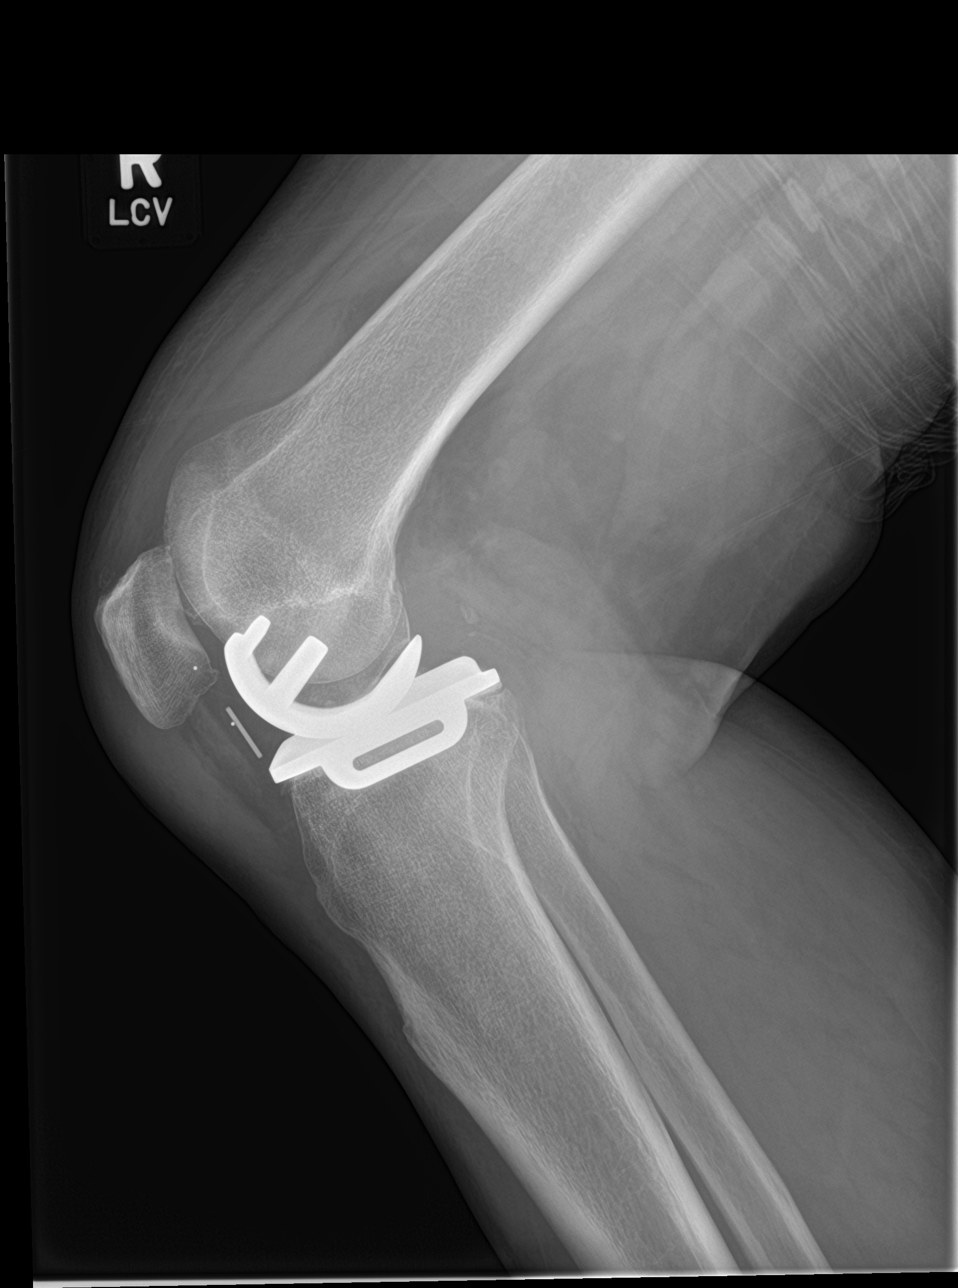

[4 of 4 positions shown; findings below may reference images not displayed]

FINDINGS: Patient with medial hemiarthroplasty. There is an abnormal
appearance of the hardware with apparent anterior migration of the
radiolucent spacer device. There appears to be metal on metal
contact of the arthroplasty hardware. On the oblique view there is a
fracture fragment which appears similar to prior exam and it is
unclear if this is related to prior injury or current dislocation.
Moderate joint effusion.
IMPRESSION: Findings most compatible with anterior and lateral displacement of
the radiolucent spacer device of the hemiarthroplasty.

On the oblique view there is a fracture fragment which may be
related to prior injury or current dislocation.

## 2018-06-02 IMAGING — DX DG KNEE 1-2V PORT*R*
2 series · 2 of 2 positions shown · non-contrast
Comparison: Prior radiograph from 06/04/2016.

CLINICAL DATA: Initial evaluation status post right total knee
arthroplasty.

EXAM:
PORTABLE RIGHT KNEE - 1-2 VIEW

[knee ap]
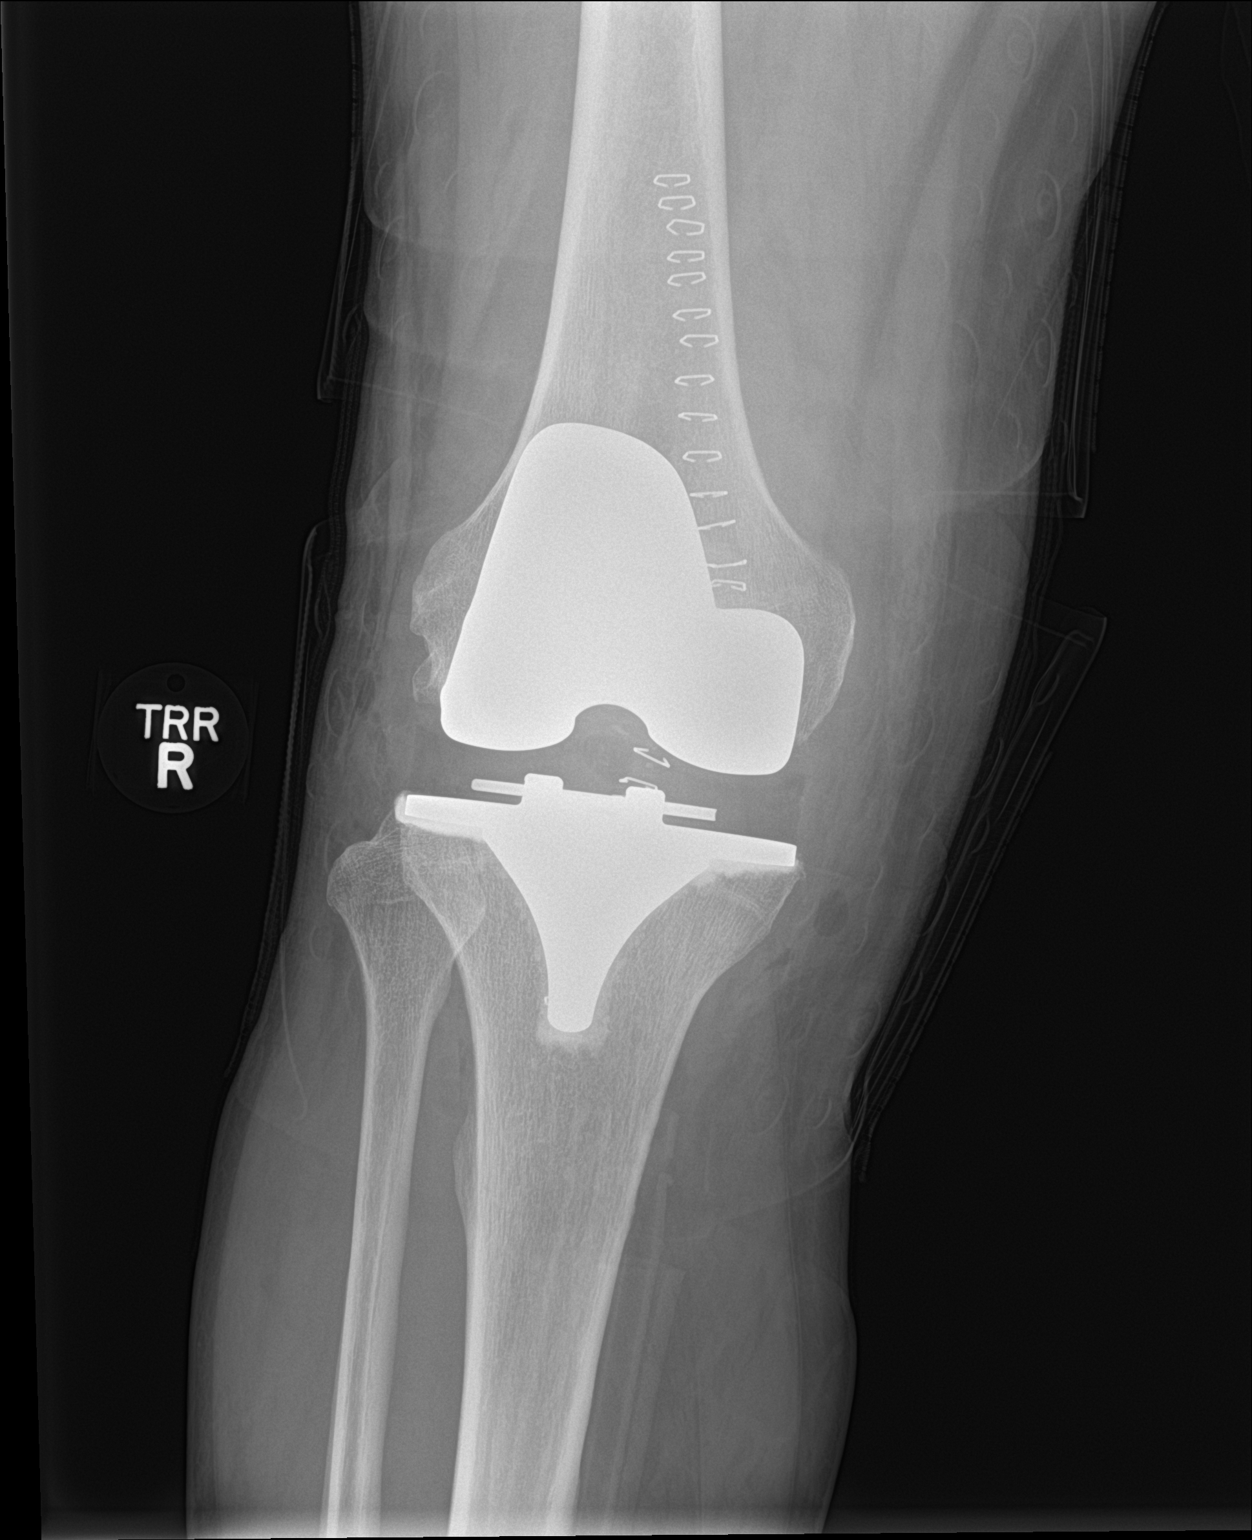

[knee lat]
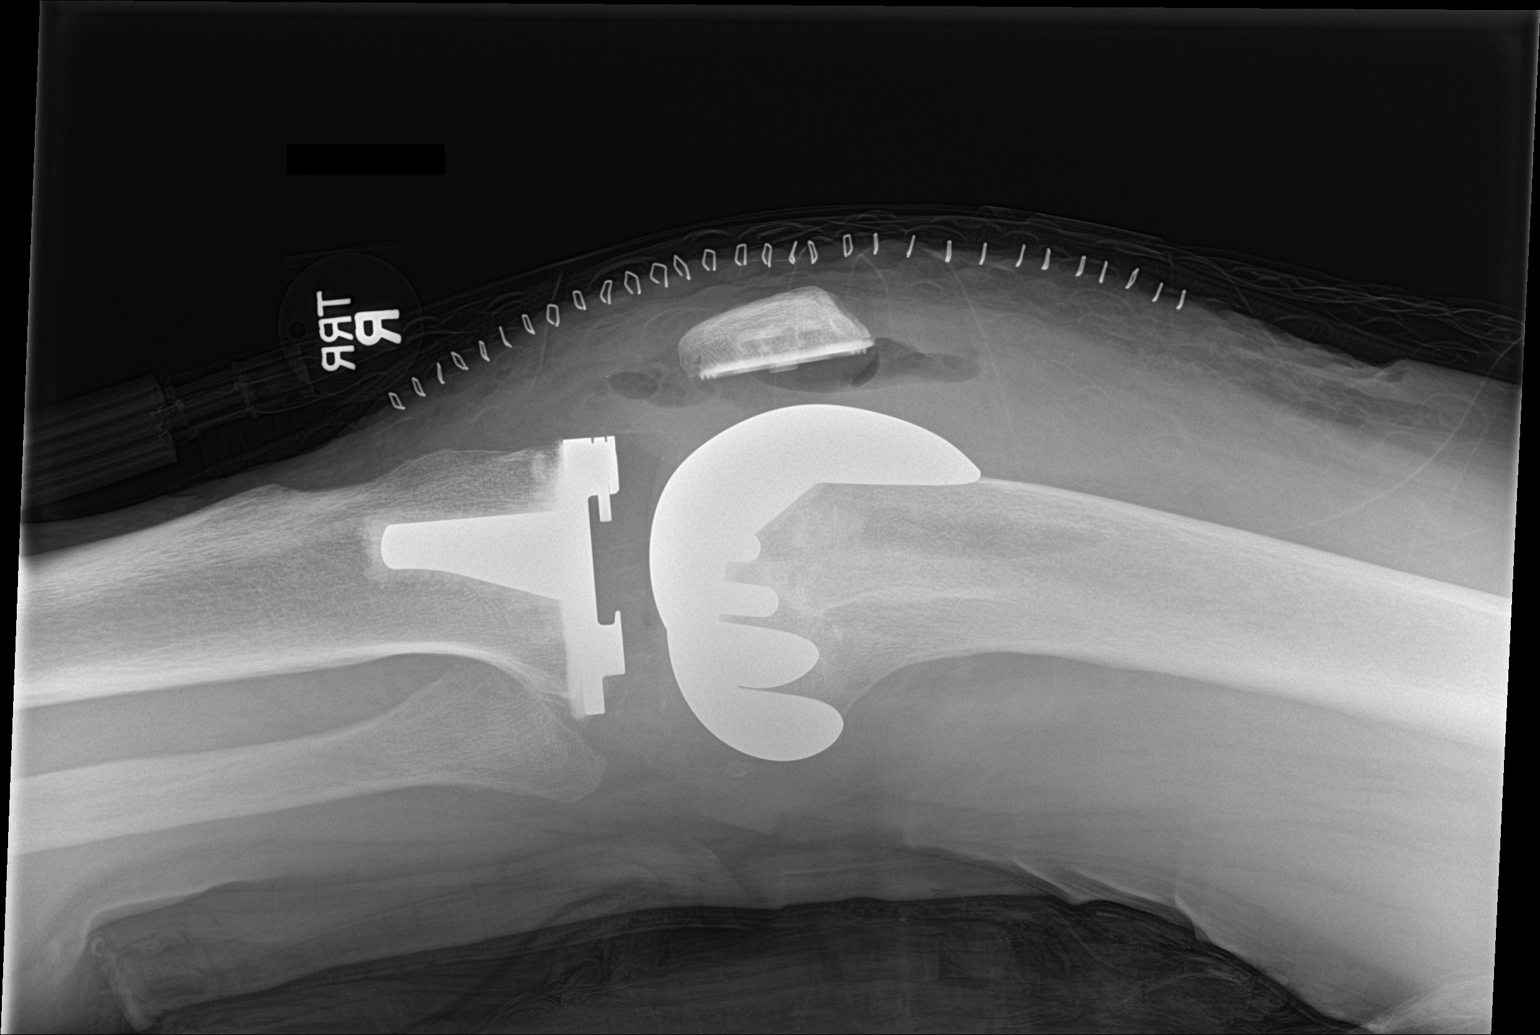

[2 of 2 positions shown; findings below may reference images not displayed]

FINDINGS: Postoperative changes from recent ORIF for a a right total knee
arthroplasty seen. Femoral and acetabular components appear well
seated and normally positioned. No periprosthetic fracture or other
complication. Postoperative swelling with soft tissue emphysema
about the right knee. Skin staples remain in place.
IMPRESSION: Postoperative changes from recent ORIF for right total knee
arthroplasty. No complication.

## 2018-06-04 IMAGING — CR DG CHEST 2V
1 series · 3 of 3 positions shown · non-contrast
Comparison: April 06, 2016

CLINICAL DATA: Fever.  Recent knee replacement surgery

EXAM:
CHEST  2 VIEW

[Series 1: dg chest 2 view · 0.14mm/px · 3 of 3 slices shown]
[im 1/3]
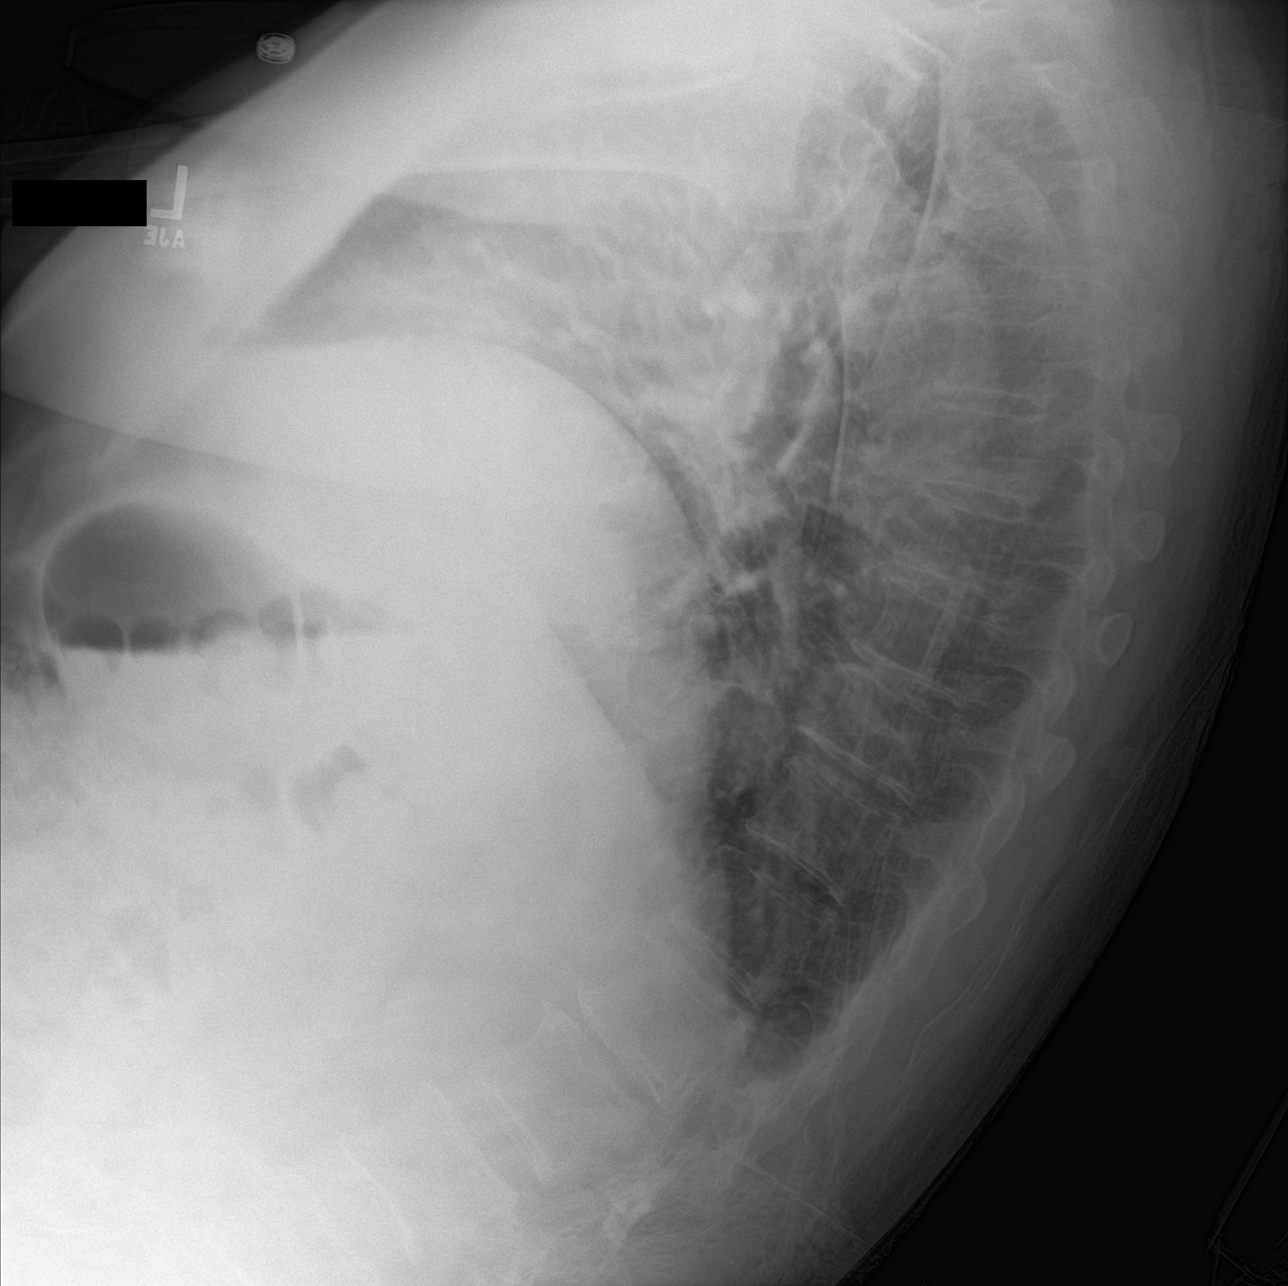
[im 2/3]
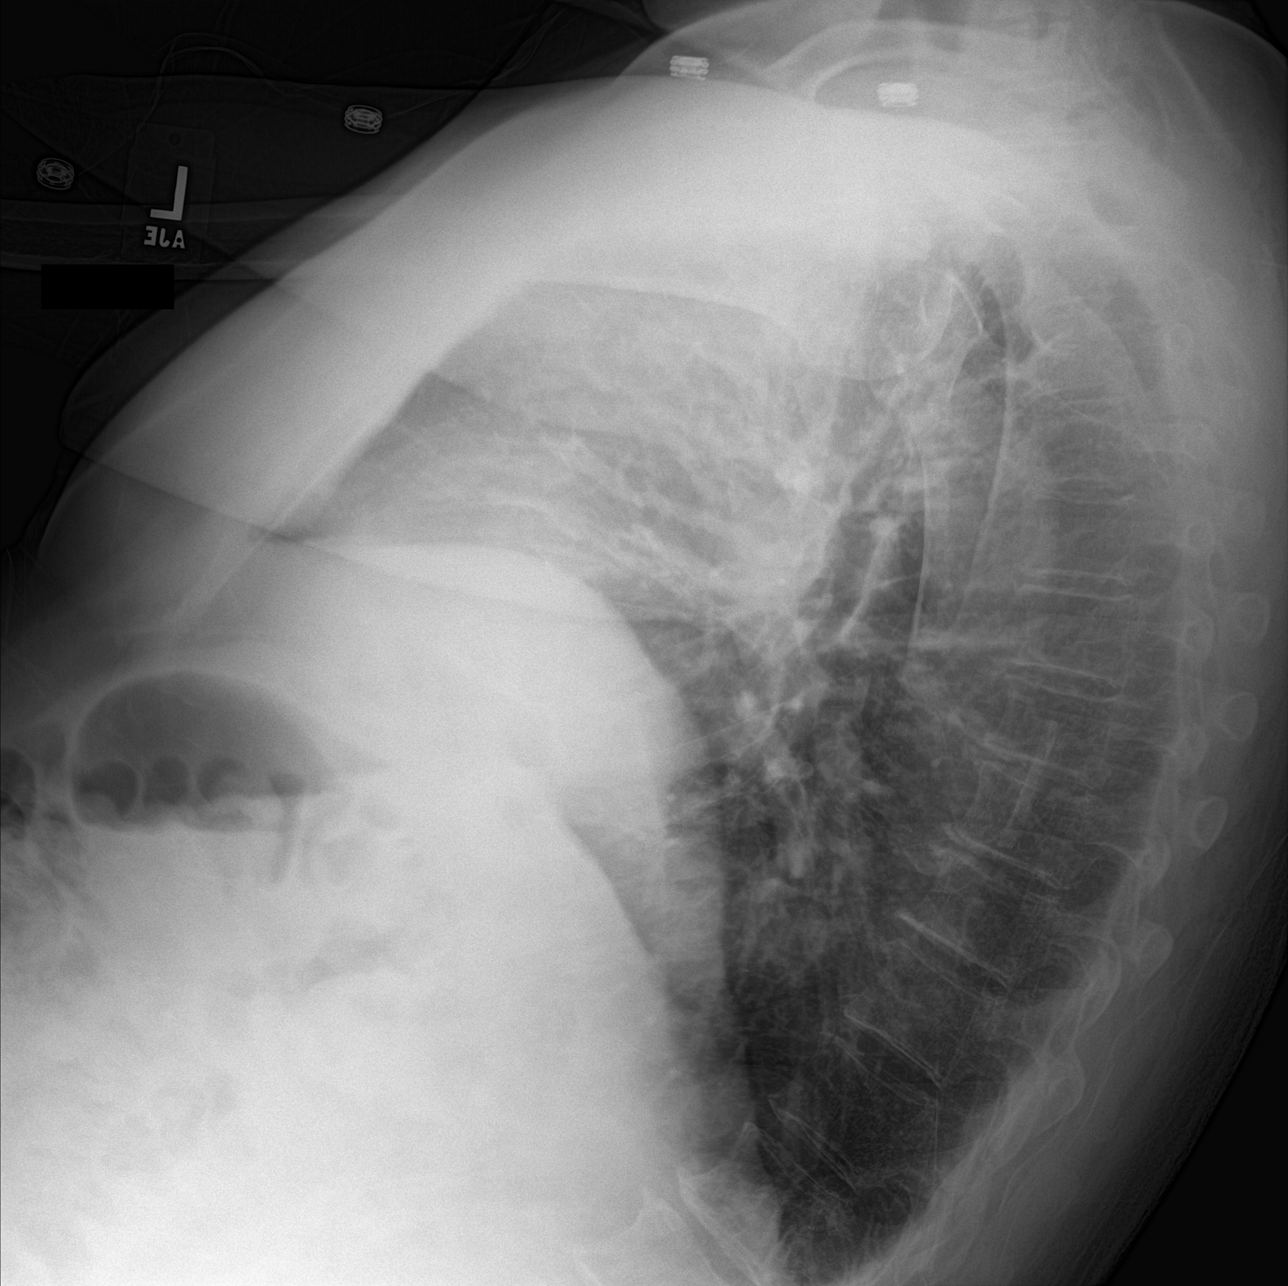
[im 3/3]
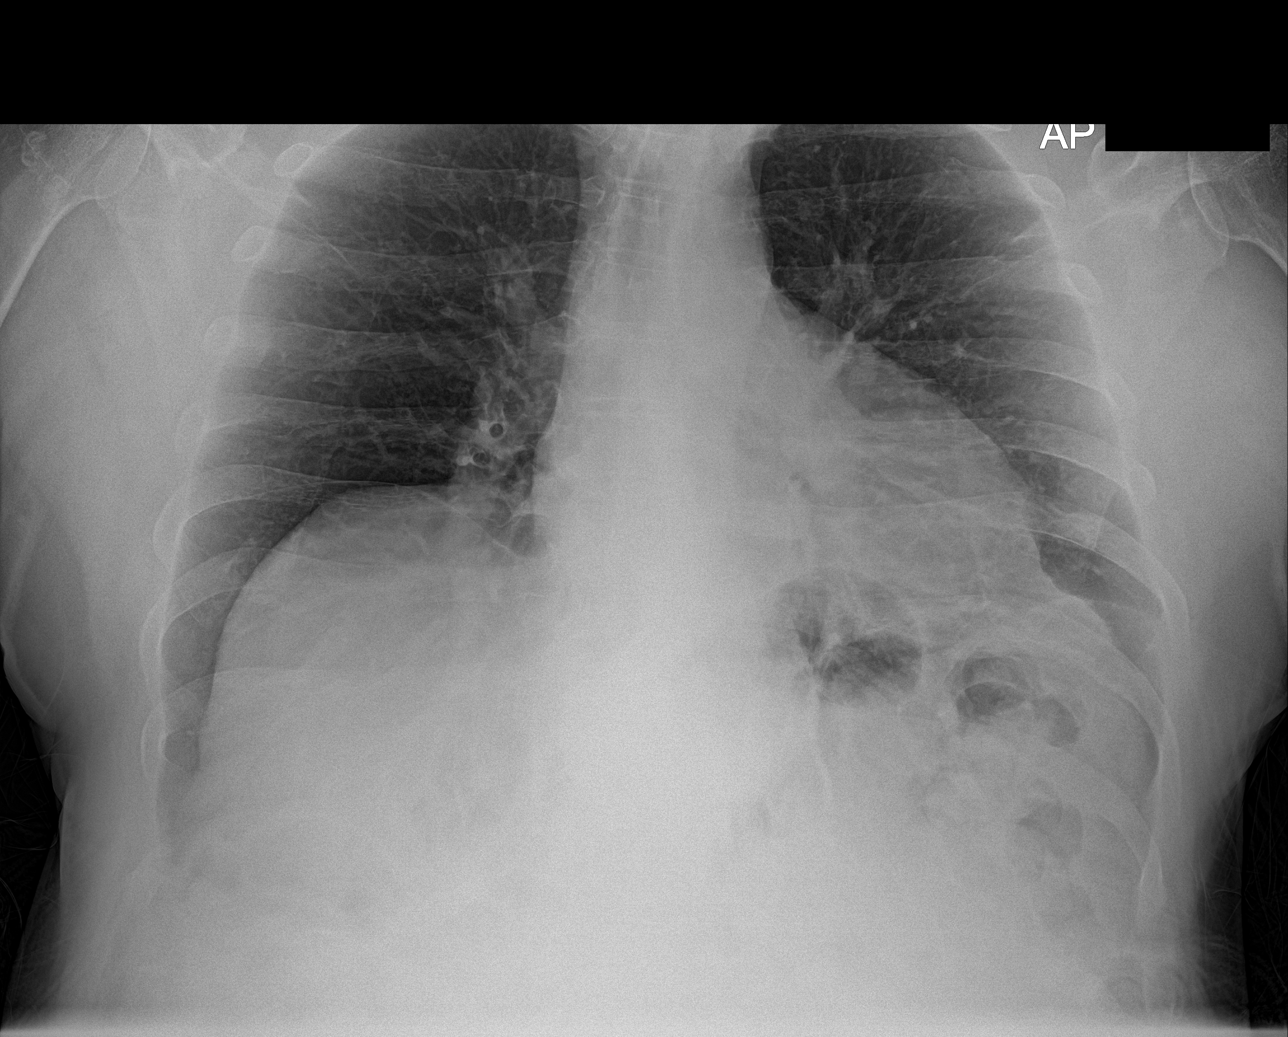

[3 of 3 positions shown; findings below may reference images not displayed]

FINDINGS: There is mild eventration of the right hemidiaphragm, stable. There
is no edema or consolidation. Heart is upper normal in size with
pulmonary vascularity within normal limits. No adenopathy. No bone
lesions. Previously noted small hiatal hernia is not appreciable on
this current examination.
IMPRESSION: Right hemidiaphragm eventration.  No edema or consolidation.

## 2018-11-06 ENCOUNTER — Other Ambulatory Visit: Payer: Self-pay | Admitting: Family Medicine

## 2018-11-06 DIAGNOSIS — Z8546 Personal history of malignant neoplasm of prostate: Secondary | ICD-10-CM

## 2018-11-07 ENCOUNTER — Other Ambulatory Visit: Payer: Self-pay

## 2018-11-07 ENCOUNTER — Other Ambulatory Visit: Payer: Medicare Other

## 2018-11-07 DIAGNOSIS — Z8546 Personal history of malignant neoplasm of prostate: Secondary | ICD-10-CM

## 2018-11-08 LAB — PSA: Prostate Specific Ag, Serum: 2.5 ng/mL (ref 0.0–4.0)

## 2018-11-13 NOTE — Progress Notes (Signed)
11/14/2018 8:48 AM   Dixon Boos [birth year] [id number]  Referring provider: Glendon Axe, MD [place] [facility] [place], [place] [address]  Chief Complaint  Patient presents with  . H/O Prostate Cancer    HPI: Patient is a 73 year old Caucasian male with a history of prostate cancer and BPH with LUTS who presents today for a yearly follow up.  History of prostate cancer Patient underwent TURP on 01/17/2007 by Dr. Tana Conch Mynatt at [facility] and 2 chips returned with focal adenocarcinoma, Gleason 2 + 3=5. Most recent PSA was 2.5 ng/mL on 11/07/2018.      BPH WITH LUTS  (prostate and/or bladder) IPSS score: 5/1   Previous score: 3/1    Major complaint(s): No complaints at this time.  Denies any dysuria, hematuria or suprapubic pain.   Denies any recent fevers, chills, nausea or vomiting.  He does not have a family history of PCa.  IPSS    Row Name 11/14/18 0800         International Prostate Symptom Score   How often have you had the sensation of not emptying your bladder?  Not at All     How often have you had to urinate less than every two hours?  Less than 1 in 5 times     How often have you found you stopped and started again several times when you urinated?  Less than 1 in 5 times     How often have you found it difficult to postpone urination?  Less than half the time     How often have you had a weak urinary stream?  Not at All     How often have you had to strain to start urination?  Not at All     How many times did you typically get up at night to urinate?  1 Time     Total IPSS Score  5       Quality of Life due to urinary symptoms   If you were to spend the rest of your life with your urinary condition just the way it is now how would you feel about that?  Pleased        Score:  1-7 Mild 8-19 Moderate 20-35 Severe  PMH: Past Medical History:  Diagnosis Date  . Arthritis   . BPH with obstruction/lower  urinary tract symptoms   . Cancer ([place])   . Cataract   . DDD (degenerative disc disease), lumbar   . DJD (degenerative joint disease)   . GERD (gastroesophageal reflux disease)   . Heartburn   . HH (hiatus hernia)   . Hypogonadism in male   . Leg cramps   . Prostate cancer ([place])   . Sleep apnea    patient denies    Surgical History: Past Surgical History:  Procedure Laterality Date  . [place] STUDY N/A 04/15/2015   Procedure: [place] STUDY;  Surgeon: Josefine Class, MD;  Location: [facility] ENDOSCOPY;  Service: Endoscopy;  Laterality: N/A;  . APPENDECTOMY    . bLERETHEPLASTY    . ESOPHAGEAL MANOMETRY N/A 04/15/2015   Procedure: ESOPHAGEAL MANOMETRY (EM);  Surgeon: Josefine Class, MD;  Location: [facility] ENDOSCOPY;  Service: Endoscopy;  Laterality: N/A;  . ESOPHAGOGASTRODUODENOSCOPY (EGD) WITH PROPOFOL N/A 02/12/2015   Procedure: ESOPHAGOGASTRODUODENOSCOPY (EGD) WITH PROPOFOL;  Surgeon: Josefine Class, MD;  Location: [facility] ENDOSCOPY;  Service: Endoscopy;  Laterality: N/A;  . INCISION TENDON SHEATH  FOR TRIGGER FINGER    . PARTIAL KNEE ARTHROPLASTY Right 12/17/2015   Procedure: UNICOMPARTMENTAL KNEE;  Surgeon: Corky Mull, MD;  Location: [facility] ORS;  Service: Orthopedics;  Laterality: Right;  . PARTIAL KNEE ARTHROPLASTY Left 04/07/2016   Procedure: UNICOMPARTMENTAL KNEE/ REVISION POLYETHYLENE;  Surgeon: Corky Mull, MD;  Location: [facility] ORS;  Service: Orthopedics;  Laterality: Left;  . PLANTAR FASCITIS     . PROSTATE SURGERY    . TONSILLECTOMY    . TOTAL KNEE REVISION Right 06/07/2016   Procedure: TOTAL KNEE REVISION;  Surgeon: Corky Mull, MD;  Location: [facility] ORS;  Service: Orthopedics;  Laterality: Right;  . TURP VAPORIZATION      Home Medications:  Allergies as of 11/14/2018      Reactions   Bacitracin Anaphylaxis   Bee Venom Anaphylaxis   Ciprofloxacin Anaphylaxis   Erythromycin Ethylsuccinate Anaphylaxis   Monosodium Glutamate Anaphylaxis   Fentanyl    Heart rate  dropped to 30 beats per minute and caused convulsions.   Enoxaparin Rash      Medication List       Accurate as of November 14, 2018  8:48 AM. If you have any questions, ask your nurse or doctor.        STOP taking these medications   vitamin C 1000 MG tablet Stopped by:  Ailana Cuadrado, PA-C     TAKE these medications   aspirin 81 MG tablet Take 81 mg by mouth daily.   calcium-vitamin D 500-200 MG-UNIT tablet Commonly known as:  OSCAL WITH D Take 1 tablet by mouth daily with breakfast. Reported on 10/13/2015   EPINEPHrine 0.3 mg/0.3 mL Soaj injection Commonly known as:  EPI-PEN Inject 0.3 mg into the muscle once.   glucosamine-chondroitin 500-400 MG tablet Take 1 tablet by mouth 3 (three) times daily.   Magnesium 250 MG Tabs Take 1 tablet by mouth daily.   Multi-Vitamins Tabs Take 1 tablet by mouth daily.   pantoprazole 40 MG tablet Commonly known as:  PROTONIX Take 40 mg by mouth daily as needed. Reported on 10/13/2015   Potassium Gluconate 595 MG Caps Take 1 capsule by mouth daily.   pyridoxine 100 MG tablet Commonly known as:  B-6 Take 100 mg by mouth daily.   UNABLE TO FIND Med Name: Cinsulin   vitamin B-12 1000 MCG tablet Commonly known as:  CYANOCOBALAMIN Take by mouth.   Vitamin D3 125 MCG (5000 UT) Caps Take by mouth daily.       Allergies:  Allergies  Allergen Reactions  . Bacitracin Anaphylaxis  . Bee Venom Anaphylaxis  . Ciprofloxacin Anaphylaxis  . Erythromycin Ethylsuccinate Anaphylaxis  . Monosodium Glutamate Anaphylaxis  . Fentanyl     Heart rate dropped to 30 beats per minute and caused convulsions.  . Enoxaparin Rash    Family History: Family History  Problem Relation Age of Onset  . Stroke Father   . Heart disease Mother        Aortic Tear  . Heart failure Mother   . Kidney disease Neg Hx   . Prostate cancer Neg Hx   . Bladder Cancer Neg Hx     Social History:  reports that he has never smoked. He has never used  smokeless tobacco. He reports that he does not drink alcohol or use drugs.  ROS: UROLOGY Frequent Urination?: No Hard to postpone urination?: No Burning/pain with urination?: No Get up at night to urinate?: No Leakage of urine?: No Urine stream starts and stops?: No  Trouble starting stream?: No Do you have to strain to urinate?: No Blood in urine?: No Urinary tract infection?: No Sexually transmitted disease?: No Injury to kidneys or bladder?: No Painful intercourse?: No Weak stream?: No Erection problems?: No Penile pain?: No  Gastrointestinal Nausea?: No Vomiting?: No Indigestion/heartburn?: Yes Diarrhea?: No Constipation?: No  Constitutional Fever: No Night sweats?: No Weight loss?: No Fatigue?: No  Skin Skin rash/lesions?: No Itching?: No  Eyes Blurred vision?: No Double vision?: No  Ears/Nose/Throat Sore throat?: No Sinus problems?: No  Hematologic/Lymphatic Swollen glands?: No Easy bruising?: No  Cardiovascular Leg swelling?: No Chest pain?: No  Respiratory Cough?: No Shortness of breath?: No  Endocrine Excessive thirst?: No  Musculoskeletal Back pain?: No Joint pain?: No  Neurological Headaches?: No Dizziness?: No  Psychologic Depression?: No Anxiety?: No  Physical Exam: BP (!) 143/84 (BP Location: Left Arm, Patient Position: Sitting, Cuff Size: Normal)   Pulse 74   Ht 5\' 11"  (1.803 m)   Wt 205 lb 1.6 oz (93 kg)   BMI 28.61 kg/m   Constitutional:  Well nourished. Alert and oriented, No acute distress. HEENT: [place] AT, moist mucus membranes.  Trachea midline, no masses. Cardiovascular: No clubbing, cyanosis, or edema. Respiratory: Normal respiratory effort, no increased work of breathing. GI: Abdomen is soft, non tender, non distended, no abdominal masses. Liver and spleen not palpable.  No hernias appreciated.  Stool sample for occult testing is not indicated.   GU: No CVA tenderness.  No bladder fullness or masses.  Patient  with circumcised phallus. Urethral meatus is patent.  No penile discharge. No penile lesions or rashes. Scrotum without lesions, cysts, rashes and/or edema.  Testicles are located scrotally bilaterally. No masses are appreciated in the testicles. Left and right epididymis are normal. Rectal: Patient with  normal sphincter tone. Anus and perineum without scarring or rashes. No rectal masses are appreciated. Prostate is approximately 60 grams, could only palpate the apex and midportion of the gland, no nodules are appreciated.  Skin: No rashes, bruises or suspicious lesions. Lymph: No cervical or inguinal adenopathy. Neurologic: Grossly intact, no focal deficits, moving all 4 extremities. Psychiatric: Normal mood and affect.   Laboratory Data: PSA History:  6.5 ng/mL on 05/26/2005  5.3 ng/mL on 06/16/2005  3.7 ng/mL on 12/06/2006  1.2 ng/mL on 05/02/2007  1.6 ng/mL on 10/31/2007  1.3 ng/mL on 10/22/2008  1.6 ng/mL on 04/22/2009  1.8 ng/mL on 10/28/2009  1.8 ng/mL on 05/05/2010  1.8 ng/mL on 12/22/2011  1.8 ng/mL on 12/20/2012  2.0 ng/mL on 10/16/2013  2.1 ng/mL on 10/17/2014             1.62 ng/mL on 11/07/2014  2.3 ng/mL on 10/13/2015  2.43 ng/mL on 11/09/2015  1.8 ng/mL on 10/04/2016  1.9 ng/mL on 09/29/2017  2.08 ng/mL on 11/14/2017  2.5 ng/mL on 11/07/2018  I have reviewed the labs.  Assessment & Plan:    1. History of prostate cancer  Patient underwent TURP on 01/17/2007 by Dr. Tana Conch Mynatt at [facility] and 2 chips returned with focal adenocarcinoma, Gleason 2 + 3=5. Most recent PSA was 2.5 ng/mL on 11/07/2018 which is an increase.  Will have patient repeat the PSA in three months.    2. BPH with LU TS IPSS score is  5/1- it is worsening, but patient is still satisfied with his urination Continue to manage conservatively  Returning in three months for PSA if only  Return in about 3 months (around 02/14/2019) for PSA only .  These notes generated with voice  recognition software. I apologize for typographical errors.  Zara Council, PA-C  [facility] [address] [place], [place] [address] [phone]

## 2018-11-14 ENCOUNTER — Other Ambulatory Visit: Payer: Self-pay

## 2018-11-14 ENCOUNTER — Encounter: Payer: Self-pay | Admitting: Urology

## 2018-11-14 ENCOUNTER — Ambulatory Visit (INDEPENDENT_AMBULATORY_CARE_PROVIDER_SITE_OTHER): Payer: Medicare Other | Admitting: Urology

## 2018-11-14 VITALS — BP 143/84 | HR 74 | Ht 71.0 in | Wt 205.1 lb

## 2018-11-14 DIAGNOSIS — Z8546 Personal history of malignant neoplasm of prostate: Secondary | ICD-10-CM | POA: Diagnosis not present

## 2018-11-14 DIAGNOSIS — N138 Other obstructive and reflux uropathy: Secondary | ICD-10-CM | POA: Diagnosis not present

## 2018-11-14 DIAGNOSIS — N401 Enlarged prostate with lower urinary tract symptoms: Secondary | ICD-10-CM | POA: Diagnosis not present

## 2019-02-19 ENCOUNTER — Other Ambulatory Visit: Payer: Self-pay

## 2019-02-19 DIAGNOSIS — Z8546 Personal history of malignant neoplasm of prostate: Secondary | ICD-10-CM

## 2019-02-20 ENCOUNTER — Other Ambulatory Visit: Payer: Medicare Other

## 2019-02-20 ENCOUNTER — Other Ambulatory Visit: Payer: Self-pay

## 2019-02-20 DIAGNOSIS — Z8546 Personal history of malignant neoplasm of prostate: Secondary | ICD-10-CM

## 2019-02-21 LAB — PSA: Prostate Specific Ag, Serum: 1.8 ng/mL (ref 0.0–4.0)

## 2019-02-25 ENCOUNTER — Telehealth: Payer: Self-pay | Admitting: *Deleted

## 2019-02-25 NOTE — Telephone Encounter (Addendum)
Patient informed-lab and follow up appointment scheduled. Verbalized understanding.  ----- Message from Nori Riis, PA-C sent at 02/25/2019  7:37 AM EDT ----- Please let Mr. Harkcom know that his PSA has returned to baseline.  I would like to see him in 6 months with a PSA prior to his appointment.

## 2019-03-15 DIAGNOSIS — Z23 Encounter for immunization: Secondary | ICD-10-CM | POA: Diagnosis not present

## 2019-08-26 ENCOUNTER — Other Ambulatory Visit: Payer: Self-pay | Admitting: *Deleted

## 2019-08-26 DIAGNOSIS — R972 Elevated prostate specific antigen [PSA]: Secondary | ICD-10-CM

## 2019-08-27 ENCOUNTER — Other Ambulatory Visit: Payer: Self-pay

## 2019-08-27 ENCOUNTER — Other Ambulatory Visit: Payer: Medicare Other

## 2019-08-27 DIAGNOSIS — R972 Elevated prostate specific antigen [PSA]: Secondary | ICD-10-CM | POA: Diagnosis not present

## 2019-08-29 ENCOUNTER — Ambulatory Visit (INDEPENDENT_AMBULATORY_CARE_PROVIDER_SITE_OTHER): Payer: Medicare Other | Admitting: Urology

## 2019-08-29 ENCOUNTER — Other Ambulatory Visit: Payer: Self-pay

## 2019-08-29 ENCOUNTER — Encounter: Payer: Self-pay | Admitting: Urology

## 2019-08-29 VITALS — BP 149/89 | HR 73 | Ht 71.0 in | Wt 196.6 lb

## 2019-08-29 DIAGNOSIS — N401 Enlarged prostate with lower urinary tract symptoms: Secondary | ICD-10-CM | POA: Diagnosis not present

## 2019-08-29 DIAGNOSIS — N138 Other obstructive and reflux uropathy: Secondary | ICD-10-CM | POA: Diagnosis not present

## 2019-08-29 DIAGNOSIS — Z8546 Personal history of malignant neoplasm of prostate: Secondary | ICD-10-CM

## 2019-08-29 LAB — PSA: Prostate Specific Ag, Serum: 2.4 ng/mL (ref 0.0–4.0)

## 2019-08-29 NOTE — Progress Notes (Signed)
08/29/2019 8:42 AM   Alexander Woods 06/26/1945 [id number]  Referring provider: No referring provider defined for this encounter.  Chief Complaint  Patient presents with  . Follow-up    HPI: Patient is a 74 year old male with a history of prostate cancer and BPH with LUTS who presents today for a yearly follow up.  History of prostate cancer Patient underwent TURP on 01/17/2007 by Dr. Tana Conch Mynatt at [facility] and 2 chips returned with focal adenocarcinoma, Gleason 2 + 3=5. Most recent PSA was 2.4 ng/mL on 08/27/2019.      BPH WITH LUTS  (prostate and/or bladder) IPSS score: 2/0   Previous score: 5/1    Major complaint(s): No complaints at this time.  Denies any dysuria, hematuria or suprapubic pain.  Denies any recent fevers, chills, nausea or vomiting.  He does not have a family history of PCa.  IPSS    Row Name 08/29/19 0800         International Prostate Symptom Score   How often have you had the sensation of not emptying your bladder?  Not at All     How often have you had to urinate less than every two hours?  Less than 1 in 5 times     How often have you found you stopped and started again several times when you urinated?  Not at All     How often have you found it difficult to postpone urination?  Not at All     How often have you had a weak urinary stream?  Not at All     How often have you had to strain to start urination?  Not at All     How many times did you typically get up at night to urinate?  1 Time     Total IPSS Score  2       Quality of Life due to urinary symptoms   If you were to spend the rest of your life with your urinary condition just the way it is now how would you feel about that?  Delighted        Score:  1-7 Mild 8-19 Moderate 20-35 Severe  PMH: Past Medical History:  Diagnosis Date  . Arthritis   . BPH with obstruction/lower urinary tract symptoms   . Cancer ([place])   . Cataract   . DDD (degenerative disc  disease), lumbar   . DJD (degenerative joint disease)   . GERD (gastroesophageal reflux disease)   . Heartburn   . HH (hiatus hernia)   . Hypogonadism in male   . Leg cramps   . Prostate cancer ([place])   . Sleep apnea    patient denies    Surgical History: Past Surgical History:  Procedure Laterality Date  . [place] STUDY N/A 04/15/2015   Procedure: [place] STUDY;  Surgeon: Josefine Class, MD;  Location: [facility] ENDOSCOPY;  Service: Endoscopy;  Laterality: N/A;  . APPENDECTOMY    . bLERETHEPLASTY    . ESOPHAGEAL MANOMETRY N/A 04/15/2015   Procedure: ESOPHAGEAL MANOMETRY (EM);  Surgeon: Josefine Class, MD;  Location: [facility] ENDOSCOPY;  Service: Endoscopy;  Laterality: N/A;  . ESOPHAGOGASTRODUODENOSCOPY (EGD) WITH PROPOFOL N/A 02/12/2015   Procedure: ESOPHAGOGASTRODUODENOSCOPY (EGD) WITH PROPOFOL;  Surgeon: Josefine Class, MD;  Location: [facility] ENDOSCOPY;  Service: Endoscopy;  Laterality: N/A;  . INCISION TENDON SHEATH FOR TRIGGER FINGER    . PARTIAL KNEE ARTHROPLASTY Right 12/17/2015   Procedure:  UNICOMPARTMENTAL KNEE;  Surgeon: Corky Mull, MD;  Location: [facility] ORS;  Service: Orthopedics;  Laterality: Right;  . PARTIAL KNEE ARTHROPLASTY Left 04/07/2016   Procedure: UNICOMPARTMENTAL KNEE/ REVISION POLYETHYLENE;  Surgeon: Corky Mull, MD;  Location: [facility] ORS;  Service: Orthopedics;  Laterality: Left;  . PLANTAR FASCITIS     . PROSTATE SURGERY    . TONSILLECTOMY    . TOTAL KNEE REVISION Right 06/07/2016   Procedure: TOTAL KNEE REVISION;  Surgeon: Corky Mull, MD;  Location: [facility] ORS;  Service: Orthopedics;  Laterality: Right;  . TURP VAPORIZATION      Home Medications:  Allergies as of 08/29/2019      Reactions   Bacitracin Anaphylaxis   Bee Venom Anaphylaxis   Ciprofloxacin Anaphylaxis   Erythromycin Ethylsuccinate Anaphylaxis   Monosodium Glutamate Anaphylaxis   Fentanyl    Heart rate dropped to 30 beats per minute and caused convulsions.   Enoxaparin Rash       Medication List       Accurate as of August 29, 2019  8:42 AM. If you have any questions, ask your nurse or doctor.        STOP taking these medications   pyridoxine 100 MG tablet Commonly known as: B-6 Stopped by: Roxan Yamamoto, PA-C   vitamin B-12 1000 MCG tablet Commonly known as: CYANOCOBALAMIN Stopped by: Laveda Demedeiros, PA-C     TAKE these medications   ascorbic acid 1000 MG tablet Commonly known as: VITAMIN C Take by mouth.   aspirin 81 MG tablet Take 81 mg by mouth daily.   calcium-vitamin D 500-200 MG-UNIT tablet Commonly known as: OSCAL WITH D Take 1 tablet by mouth daily with breakfast. Reported on 10/13/2015   CINNAMON PO Take by mouth.   EPINEPHrine 0.3 mg/0.3 mL Soaj injection Commonly known as: EPI-PEN Inject 0.3 mg into the muscle once.   glucosamine-chondroitin 500-400 MG tablet Take 2 tablets by mouth once.   Magnesium 250 MG Tabs Take 1 tablet by mouth daily.   Multi-Vitamins Tabs Take 1 tablet by mouth daily.   Oyster Shell Calcium 500-400 MG-UNIT Tabs Take by mouth.   pantoprazole 40 MG tablet Commonly known as: PROTONIX Take 40 mg by mouth daily as needed. Reported on 10/13/2015   Potassium Gluconate 595 MG Caps Take 1 capsule by mouth daily.   SUPER B COMPLEX PO   Vitamin D3 125 MCG (5000 UT) Caps Take by mouth daily.       Allergies:  Allergies  Allergen Reactions  . Bacitracin Anaphylaxis  . Bee Venom Anaphylaxis  . Ciprofloxacin Anaphylaxis  . Erythromycin Ethylsuccinate Anaphylaxis  . Monosodium Glutamate Anaphylaxis  . Fentanyl     Heart rate dropped to 30 beats per minute and caused convulsions.  . Enoxaparin Rash    Family History: Family History  Problem Relation Age of Onset  . Stroke Father   . Heart disease Mother        Aortic Tear  . Heart failure Mother   . Kidney disease Neg Hx   . Prostate cancer Neg Hx   . Bladder Cancer Neg Hx     Social History:  reports that he has never smoked. He has  never used smokeless tobacco. He reports that he does not drink alcohol or use drugs.  ROS: For pertinent review of systems please refer to history of present illness  Physical Exam: BP (!) 149/89 (BP Location: Left Arm, Patient Position: Sitting, Cuff Size: Normal)   Pulse 73  Ht 5\' 11"  (1.803 m)   Wt 196 lb 9.6 oz (89.2 kg)   BMI 27.42 kg/m   Constitutional:  Well nourished. Alert and oriented, No acute distress. HEENT: [place] AT, mask in place.  Trachea midline, no masses. Cardiovascular: No clubbing, cyanosis, or edema. Respiratory: Normal respiratory effort, no increased work of breathing. GI: Abdomen is soft, non tender, non distended, no abdominal masses.  GU: No CVA tenderness.  No bladder fullness or masses.  Patient with circumcised phallus. Urethral meatus is patent.  No penile discharge. No penile lesions or rashes. Scrotum without lesions, cysts, rashes and/or edema.  Testicles are located scrotally bilaterally. No masses are appreciated in the testicles. Left and right epididymis are normal. Rectal: Patient with  normal sphincter tone. Anus and perineum without scarring or rashes. No rectal masses are appreciated. Prostate is approximately 55 grams, no nodules are appreciated. Seminal vesicles could not be palpated.   Skin: No rashes, bruises or suspicious lesions. Lymph: No inguinal adenopathy. Neurologic: Grossly intact, no focal deficits, moving all 4 extremities. Psychiatric: Normal mood and affect.  Laboratory Data: PSA History:  6.5 ng/mL on 05/26/2005  5.3 ng/mL on 06/16/2005  3.7 ng/mL on 12/06/2006  1.2 ng/mL on 05/02/2007  1.6 ng/mL on 10/31/2007  1.3 ng/mL on 10/22/2008  1.6 ng/mL on 04/22/2009  1.8 ng/mL on 10/28/2009  1.8 ng/mL on 05/05/2010  1.8 ng/mL on 12/22/2011  1.8 ng/mL on 12/20/2012  2.0 ng/mL on 10/16/2013  2.1 ng/mL on 10/17/2014             1.62 ng/mL on 11/07/2014  Component     Latest Ref Rng & Units 10/13/2015 10/04/2016 09/29/2017 11/07/2018   Prostate Specific Ag, Serum     0.0 - 4.0 ng/mL 2.3 1.8 1.9 2.5   Component     Latest Ref Rng & Units 02/20/2019 08/27/2019  Prostate Specific Ag, Serum     0.0 - 4.0 ng/mL 1.8 2.4   I have reviewed the labs.  Assessment & Plan:    1. History of prostate cancer  Patient underwent TURP on 01/17/2007 by Dr. Tana Conch Mynatt at [facility] and 2 chips returned with focal adenocarcinoma, Gleason 2 + 3=5. Most recent PSA was 2.4 ng/mL on 08/25/2019 which is an increase.  Will have patient repeat the PSA in three months.    2. BPH with LU TS IPSS score is 2/0- it is improved Continue to manage conservatively  Returning in three months for PSA only  Return in about 3 months (around 11/29/2019) for PSA only .  These notes generated with voice recognition software. I apologize for typographical errors.  Zara Council, PA-C  [facility] [address] [place], [place] [address] [phone]

## 2019-10-29 DIAGNOSIS — H25813 Combined forms of age-related cataract, bilateral: Secondary | ICD-10-CM | POA: Diagnosis not present

## 2019-10-29 DIAGNOSIS — Z9889 Other specified postprocedural states: Secondary | ICD-10-CM | POA: Diagnosis not present

## 2019-10-29 DIAGNOSIS — H43813 Vitreous degeneration, bilateral: Secondary | ICD-10-CM | POA: Diagnosis not present

## 2019-11-29 ENCOUNTER — Other Ambulatory Visit: Payer: Self-pay

## 2019-11-29 ENCOUNTER — Other Ambulatory Visit: Payer: Medicare Other

## 2019-11-29 DIAGNOSIS — Z8546 Personal history of malignant neoplasm of prostate: Secondary | ICD-10-CM

## 2019-11-29 DIAGNOSIS — N401 Enlarged prostate with lower urinary tract symptoms: Secondary | ICD-10-CM

## 2019-11-29 DIAGNOSIS — N138 Other obstructive and reflux uropathy: Secondary | ICD-10-CM

## 2019-11-30 LAB — PSA: Prostate Specific Ag, Serum: 2.1 ng/mL (ref 0.0–4.0)

## 2019-12-03 ENCOUNTER — Telehealth: Payer: Self-pay | Admitting: Family Medicine

## 2019-12-03 NOTE — Telephone Encounter (Signed)
-----   Message from Nori Riis, PA-C sent at 12/01/2019  7:42 PM EDT ----- Please let [name] know that his PSA has remained around 2, I would like to see him in six months for I PSS, exam and PVR with PSA prior.

## 2019-12-03 NOTE — Telephone Encounter (Signed)
Patient notified and voiced understanding. Appointments have been made.  

## 2019-12-03 NOTE — Telephone Encounter (Signed)
-----   Message from Nori Riis, PA-C sent at 12/01/2019  7:42 PM EDT ----- Please let Mr. Laubacher know that his PSA has remained around 2, I would like to see him in six months for I PSS, exam and PVR with PSA prior.

## 2019-12-19 DIAGNOSIS — H2511 Age-related nuclear cataract, right eye: Secondary | ICD-10-CM | POA: Diagnosis not present

## 2020-01-12 DIAGNOSIS — H2512 Age-related nuclear cataract, left eye: Secondary | ICD-10-CM | POA: Diagnosis not present

## 2020-01-16 DIAGNOSIS — H25812 Combined forms of age-related cataract, left eye: Secondary | ICD-10-CM | POA: Diagnosis not present

## 2020-03-24 DIAGNOSIS — Z23 Encounter for immunization: Secondary | ICD-10-CM | POA: Diagnosis not present

## 2020-05-29 DIAGNOSIS — Z23 Encounter for immunization: Secondary | ICD-10-CM | POA: Diagnosis not present

## 2020-06-11 ENCOUNTER — Other Ambulatory Visit: Payer: Self-pay

## 2020-06-16 ENCOUNTER — Ambulatory Visit: Payer: Self-pay | Admitting: Urology

## 2020-09-04 ENCOUNTER — Other Ambulatory Visit: Payer: Self-pay

## 2020-09-04 DIAGNOSIS — R972 Elevated prostate specific antigen [PSA]: Secondary | ICD-10-CM

## 2020-09-11 ENCOUNTER — Other Ambulatory Visit: Payer: Self-pay

## 2020-09-11 ENCOUNTER — Other Ambulatory Visit: Payer: Medicare Other

## 2020-09-11 DIAGNOSIS — R972 Elevated prostate specific antigen [PSA]: Secondary | ICD-10-CM | POA: Diagnosis not present

## 2020-09-12 LAB — PSA: Prostate Specific Ag, Serum: 1.9 ng/mL (ref 0.0–4.0)

## 2020-09-14 NOTE — Progress Notes (Signed)
09/15/2020 8:33 AM   Alexander Woods 03-04-1946 [id number]  Referring provider: No referring provider defined for this encounter.  Chief Complaint  Patient presents with  . Benign Prostatic Hypertrophy   Urological history: 1. Prostate cancer -PSA 1.9 09/2020 -TURP on 01/17/2007 by Dr. Tana Conch Mynatt at [facility] and 2 chips returned with focal adenocarcinoma, Gleason 2 + 3=5  2. BPH with LU TS -I PSS 2/0   HPI: Alexander Woods is a 75 y.o. male who presents today for a 12 months follow up.    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  He underwent a contrast CT on August 14, 2020 at the [place] clinic in [facility] for a lateral lower quadrant mass was found to have inguinal hernias.  GU findings noted a left renal cyst and prostatomegaly.  I do not have access to the images, but report's are in the media section.  He has no other urinary complaints at this time.  Patient denies any modifying or aggravating factors.  Patient denies any dysuria or suprapubic/flank pain.  Patient denies any fevers, chills, nausea or vomiting.    IPSS    Row Name 09/15/20 0800         International Prostate Symptom Score   How often have you had the sensation of not emptying your bladder? Not at All     How often have you had to urinate less than every two hours? Less than 1 in 5 times     How often have you found you stopped and started again several times when you urinated? Not at All     How often have you found it difficult to postpone urination? Not at All     How often have you had a weak urinary stream? Not at All     How often have you had to strain to start urination? Not at All     How many times did you typically get up at night to urinate? 1 Time     Total IPSS Score 2           Quality of Life due to urinary symptoms   If you were  to spend the rest of your life with your urinary condition just the way it is now how would you feel about that? Delighted            Score:  1-7 Mild 8-19 Moderate 20-35 Severe  PMH: Past Medical History:  Diagnosis Date  . Arthritis   . BPH with obstruction/lower urinary tract symptoms   . Cancer ([place])   . Cataract   . DDD (degenerative disc disease), lumbar   . DJD (degenerative joint disease)   . GERD (gastroesophageal reflux disease)   . Heartburn   . HH (hiatus hernia)   . Hypogonadism in male   . Leg cramps   . Prostate cancer ([place])   . Sleep apnea    patient denies    Surgical History: Past Surgical History:  Procedure Laterality Date  . [place] STUDY N/A 04/15/2015   Procedure: [place] STUDY;  Surgeon: Josefine Class, MD;  Location: [facility] ENDOSCOPY;  Service: Endoscopy;  Laterality: N/A;  . APPENDECTOMY    . bLERETHEPLASTY    . ESOPHAGEAL MANOMETRY N/A 04/15/2015   Procedure: ESOPHAGEAL MANOMETRY (EM);  Surgeon: Josefine Class, MD;  Location: [facility] ENDOSCOPY;  Service: Endoscopy;  Laterality: N/A;  . ESOPHAGOGASTRODUODENOSCOPY (EGD) WITH PROPOFOL N/A 02/12/2015   Procedure: ESOPHAGOGASTRODUODENOSCOPY (EGD) WITH PROPOFOL;  Surgeon: Josefine Class, MD;  Location: [facility] ENDOSCOPY;  Service: Endoscopy;  Laterality: N/A;  . INCISION TENDON SHEATH FOR TRIGGER FINGER    . PARTIAL KNEE ARTHROPLASTY Right 12/17/2015   Procedure: UNICOMPARTMENTAL KNEE;  Surgeon: Corky Mull, MD;  Location: [facility] ORS;  Service: Orthopedics;  Laterality: Right;  . PARTIAL KNEE ARTHROPLASTY Left 04/07/2016   Procedure: UNICOMPARTMENTAL KNEE/ REVISION POLYETHYLENE;  Surgeon: Corky Mull, MD;  Location: [facility] ORS;  Service: Orthopedics;  Laterality: Left;  . PLANTAR FASCITIS     . PROSTATE SURGERY    . TONSILLECTOMY    . TOTAL KNEE REVISION Right 06/07/2016   Procedure: TOTAL KNEE REVISION;  Surgeon: Corky Mull, MD;  Location: [facility] ORS;  Service: Orthopedics;  Laterality:  Right;  . TURP VAPORIZATION      Home Medications:  Allergies as of 09/15/2020      Reactions   Bacitracin Rash   Bee Venom Anaphylaxis   Ciprofloxacin Anaphylaxis   Erythromycin Ethylsuccinate Rash   Monosodium Glutamate Anaphylaxis   Fentanyl    Heart rate dropped to 30 beats per minute and caused convulsions.   Enoxaparin Rash      Medication List       Accurate as of September 15, 2020 11:59 PM. If you have any questions, ask your nurse or doctor.        STOP taking these medications   SUPER B COMPLEX PO Stopped by: Zara Council, PA-C     TAKE these medications   ascorbic acid 1000 MG tablet Commonly known as: VITAMIN C Take by mouth.   aspirin 81 MG tablet Take 81 mg by mouth daily.   calcium-vitamin D 500-200 MG-UNIT tablet Commonly known as: OSCAL WITH D Take 1 tablet by mouth daily with breakfast. Reported on 10/13/2015   CINNAMON PO Take by mouth.   EPINEPHrine 0.3 mg/0.3 mL Soaj injection Commonly known as: EPI-PEN Inject 0.3 mg into the muscle once.   glucosamine-chondroitin 500-400 MG tablet Take 2 tablets by mouth once.   Magnesium 250 MG Tabs Take 1 tablet by mouth daily.   Multi-Vitamins Tabs Take 1 tablet by mouth daily.   Oyster Shell Calcium 500-400 MG-UNIT Tabs Take by mouth.   pantoprazole 40 MG tablet Commonly known as: PROTONIX Take 40 mg by mouth daily as needed. Reported on 10/13/2015   Potassium Gluconate 595 MG Caps Take 1 capsule by mouth daily.   Vitamin D3 125 MCG (5000 UT) Caps Take by mouth daily.       Allergies:  Allergies  Allergen Reactions  . Bacitracin Rash  . Bee Venom Anaphylaxis  . Ciprofloxacin Anaphylaxis  . Erythromycin Ethylsuccinate Rash  . Monosodium Glutamate Anaphylaxis  . Fentanyl     Heart rate dropped to 30 beats per minute and caused convulsions.  . Enoxaparin Rash    Family History: Family History  Problem Relation Age of Onset  . Stroke Father   . Heart disease Mother         Aortic Tear  . Heart failure Mother   . Kidney disease Neg Hx   . Prostate cancer Neg Hx   . Bladder Cancer Neg Hx     Social History:  reports that he has never smoked. He has never used smokeless tobacco. He reports that he does not drink alcohol and does not use drugs.  ROS: For pertinent review of systems please refer to history of present illness  Physical Exam: BP (!) 144/90   Pulse 75   Ht 5\' 11"  (1.803 m)   Wt 188 lb (85.3 kg)   BMI 26.22 kg/m   Constitutional:  Well nourished. Alert and oriented, No acute distress. HEENT: [place] AT, mask in place.  Trachea midline Cardiovascular: No clubbing, cyanosis, or edema. Respiratory: Normal respiratory effort, no increased work of breathing. GU: No CVA tenderness.  No bladder fullness or masses.  Patient with circumcised phallus. Urethral meatus is patent.  No penile discharge. No penile lesions or rashes. Scrotum without lesions, cysts, rashes and/or edema.  Testicles are located scrotally bilaterally. No masses are appreciated in the testicles. Left and right epididymis are normal. Rectal: Patient with  normal sphincter tone. Anus and perineum without scarring or rashes. No rectal masses are appreciated. Prostate is approximately 60 grams, no nodules are appreciated. Seminal vesicles could not be palpated Lymph: No inguinal adenopathy. Neurologic: Grossly intact, no focal deficits, moving all 4 extremities. Psychiatric: Normal mood and affect.  Laboratory Data: PSA History:  6.5 ng/mL on 05/26/2005  5.3 ng/mL on 06/16/2005  3.7 ng/mL on 12/06/2006  1.2 ng/mL on 05/02/2007  1.6 ng/mL on 10/31/2007  1.3 ng/mL on 10/22/2008  1.6 ng/mL on 04/22/2009  1.8 ng/mL on 10/28/2009  1.8 ng/mL on 05/05/2010  1.8 ng/mL on 12/22/2011  1.8 ng/mL on 12/20/2012  2.0 ng/mL on 10/16/2013  2.1 ng/mL on 10/17/2014           1.62 ng/mL on 11/07/2014   Component     Latest Ref Rng & Units 10/13/2015 10/04/2016 09/29/2017 11/07/2018  Prostate Specific  Ag, Serum     0.0 - 4.0 ng/mL 2.3 1.8 1.9 2.5   Component     Latest Ref Rng & Units 02/20/2019 08/27/2019 11/29/2019 09/11/2020  Prostate Specific Ag, Serum     0.0 - 4.0 ng/mL 1.8 2.4 2.1 1.9  I have reviewed the labs.  Assessment & Plan:    1. Prostate cancer -PSA stable  2. BPH with LU TS -prostatomegaly seen on recent CT 08/2020 -no bothersome symptoms   3. Blood clots in urine -I recommended that he undergo a cystoscopic examination as his recent contrast CT would not be able to evaluate the internal structures of his bladder prostate and urethra as the passage of blood clots in the urine may be due to a malignancy rather than just physical strain on his body.  I also explained that his recent CT scan and recent blood work that he had with [place] in the [place] would not be able to rule out a bladder cancer that we would need direct visualization of the bladder through the cystoscope.  He adamantly declined this evaluation at this time, but he stated if it became worse he would reconsider.  Return in about 1 year (around 09/15/2021) for IPSS, PSA and exam.  These notes generated with voice recognition software. I apologize for typographical errors.  Zara Council, PA-C  [facility] [address] [place], [place] [address] [phone]

## 2020-09-15 ENCOUNTER — Other Ambulatory Visit: Payer: Self-pay

## 2020-09-15 ENCOUNTER — Ambulatory Visit (INDEPENDENT_AMBULATORY_CARE_PROVIDER_SITE_OTHER): Payer: Medicare Other | Admitting: Urology

## 2020-09-15 ENCOUNTER — Encounter: Payer: Self-pay | Admitting: Urology

## 2020-09-15 VITALS — BP 144/90 | HR 75 | Ht 71.0 in | Wt 188.0 lb

## 2020-09-15 DIAGNOSIS — N138 Other obstructive and reflux uropathy: Secondary | ICD-10-CM

## 2020-09-15 DIAGNOSIS — R310 Gross hematuria: Secondary | ICD-10-CM

## 2020-09-15 DIAGNOSIS — N401 Enlarged prostate with lower urinary tract symptoms: Secondary | ICD-10-CM | POA: Diagnosis not present

## 2020-09-15 DIAGNOSIS — C61 Malignant neoplasm of prostate: Secondary | ICD-10-CM | POA: Diagnosis not present

## 2020-09-29 DIAGNOSIS — Z961 Presence of intraocular lens: Secondary | ICD-10-CM | POA: Diagnosis not present

## 2020-09-29 DIAGNOSIS — H43813 Vitreous degeneration, bilateral: Secondary | ICD-10-CM | POA: Diagnosis not present

## 2021-03-04 DIAGNOSIS — Z23 Encounter for immunization: Secondary | ICD-10-CM | POA: Diagnosis not present

## 2021-03-08 ENCOUNTER — Ambulatory Visit (INDEPENDENT_AMBULATORY_CARE_PROVIDER_SITE_OTHER): Payer: Medicare Other | Admitting: Urology

## 2021-03-08 ENCOUNTER — Other Ambulatory Visit: Payer: Self-pay

## 2021-03-08 ENCOUNTER — Other Ambulatory Visit: Payer: Self-pay | Admitting: *Deleted

## 2021-03-08 ENCOUNTER — Other Ambulatory Visit
Admission: RE | Admit: 2021-03-08 | Discharge: 2021-03-08 | Disposition: A | Discharge status: Home / Self Care | Payer: Medicare Other | Attending: Urology | Admitting: Urology

## 2021-03-08 ENCOUNTER — Encounter: Payer: Self-pay | Admitting: Urology

## 2021-03-08 VITALS — BP 161/106 | HR 66 | Ht 71.0 in | Wt 191.0 lb

## 2021-03-08 DIAGNOSIS — C61 Malignant neoplasm of prostate: Secondary | ICD-10-CM

## 2021-03-08 DIAGNOSIS — N138 Other obstructive and reflux uropathy: Secondary | ICD-10-CM

## 2021-03-08 DIAGNOSIS — R310 Gross hematuria: Secondary | ICD-10-CM

## 2021-03-08 DIAGNOSIS — N401 Enlarged prostate with lower urinary tract symptoms: Secondary | ICD-10-CM | POA: Diagnosis not present

## 2021-03-08 LAB — URINALYSIS, COMPLETE (UACMP) WITH MICROSCOPIC
RBC / HPF: >50 RBC/hpf (ref 0–5)
Specific Gravity, Urine: 1.015 (ref 1.005–1.030)
pH: 5 (ref 5.0–8.0)

## 2021-03-08 NOTE — Progress Notes (Signed)
03/08/2021 2:00 PM   Alexander Woods [birth year] [id number]  Referring provider: No referring provider defined for this encounter.  Chief Complaint  Patient presents with   Hematuria    Urological history: 1. Prostate cancer -PSA 1.9 09/2020 -TURP on 01/17/2007 by Dr. Tana Conch Mynatt at [facility] and 2 chips returned with focal adenocarcinoma, Gleason 2 + 3=5  2. BPH with LU TS -I PSS 2/0  3. High risk hematuria -non-smoker -no formal work up - patient declined -reports of gross hematuria -UA > 50 RBC's and rare bacteria    HPI: Alexander Woods is a 75 y.o. male who presents today for gross heme.    He has been having gross heme since Sunday at 1 am. (32 hours).  He has been passing clots as well.    The blood in the urine was originally bright red, but he states he is becoming the color of coffee.    He states over the weekend he exercised on sitting peddler due to the inclement weather.    Patient denies any modifying or aggravating factors.  Patient denies any dysuria or suprapubic/flank pain.  Patient denies any fevers, chills, nausea or vomiting.    UA > 50 RBC's and rare bacteria.   PMH: Past Medical History:  Diagnosis Date   Arthritis    BPH with obstruction/lower urinary tract symptoms    Cancer (HCC)    Cataract    DDD (degenerative disc disease), lumbar    DJD (degenerative joint disease)    GERD (gastroesophageal reflux disease)    Heartburn    HH (hiatus hernia)    Hypogonadism in male    Leg cramps    Prostate cancer ([place])    Sleep apnea    patient denies    Surgical History: Past Surgical History:  Procedure Laterality Date   26 HOUR [place] STUDY N/A 04/15/2015   Procedure: [place];  Surgeon: Josefine Class, MD;  Location: [facility] ENDOSCOPY;  Service: Endoscopy;  Laterality: N/A;   APPENDECTOMY     bLERETHEPLASTY     ESOPHAGEAL MANOMETRY N/A 04/15/2015   Procedure: ESOPHAGEAL MANOMETRY (EM);  Surgeon: Josefine Class, MD;  Location: [facility] ENDOSCOPY;  Service: Endoscopy;  Laterality: N/A;   ESOPHAGOGASTRODUODENOSCOPY (EGD) WITH PROPOFOL N/A 02/12/2015   Procedure: ESOPHAGOGASTRODUODENOSCOPY (EGD) WITH PROPOFOL;  Surgeon: Josefine Class, MD;  Location: [facility] ENDOSCOPY;  Service: Endoscopy;  Laterality: N/A;   INCISION TENDON SHEATH FOR TRIGGER FINGER     PARTIAL KNEE ARTHROPLASTY Right 12/17/2015   Procedure: UNICOMPARTMENTAL KNEE;  Surgeon: Corky Mull, MD;  Location: [facility] ORS;  Service: Orthopedics;  Laterality: Right;   PARTIAL KNEE ARTHROPLASTY Left 04/07/2016   Procedure: UNICOMPARTMENTAL KNEE/ REVISION POLYETHYLENE;  Surgeon: Corky Mull, MD;  Location: [facility] ORS;  Service: Orthopedics;  Laterality: Left;   PLANTAR FASCITIS      PROSTATE SURGERY     TONSILLECTOMY     TOTAL KNEE REVISION Right 06/07/2016   Procedure: TOTAL KNEE REVISION;  Surgeon: Corky Mull, MD;  Location: [facility] ORS;  Service: Orthopedics;  Laterality: Right;   TURP VAPORIZATION      Home Medications:  Allergies as of 03/08/2021       Reactions   Bacitracin Rash   Bee Venom Anaphylaxis   Ciprofloxacin Anaphylaxis   Erythromycin Ethylsuccinate Rash   Monosodium Glutamate Anaphylaxis   Fentanyl    Heart rate dropped to 30 beats per minute and caused convulsions.   Enoxaparin Rash  Medication List        Accurate as of March 08, 2021  2:00 PM. If you have any questions, ask your nurse or doctor.          ascorbic acid 1000 MG tablet Commonly known as: VITAMIN C Take by mouth.   aspirin 81 MG tablet Take 81 mg by mouth daily.   calcium-vitamin D 500-200 MG-UNIT tablet Commonly known as: OSCAL WITH D Take 1 tablet by mouth daily with breakfast. Reported on 10/13/2015   CINNAMON PO Take by mouth.   EPINEPHrine 0.3 mg/0.3 mL Soaj injection Commonly known as: EPI-PEN Inject 0.3 mg into the muscle once.   glucosamine-chondroitin 500-400 MG tablet Take 2 tablets by mouth once.   Magnesium  250 MG Tabs Take 1 tablet by mouth daily.   Multi-Vitamins Tabs Take 1 tablet by mouth daily.   Oyster Shell Calcium 500-400 MG-UNIT Tabs Take by mouth.   pantoprazole 40 MG tablet Commonly known as: PROTONIX Take 40 mg by mouth daily as needed. Reported on 10/13/2015   Potassium Gluconate 595 MG Caps Take 1 capsule by mouth daily.   Vitamin D3 125 MCG (5000 UT) Caps Take by mouth daily.        Allergies:  Allergies  Allergen Reactions   Bacitracin Rash   Bee Venom Anaphylaxis   Ciprofloxacin Anaphylaxis   Erythromycin Ethylsuccinate Rash   Monosodium Glutamate Anaphylaxis   Fentanyl     Heart rate dropped to 30 beats per minute and caused convulsions.   Enoxaparin Rash    Family History: Family History  Problem Relation Age of Onset   Stroke Father    Heart disease Mother        Aortic Tear   Heart failure Mother    Kidney disease Neg Hx    Prostate cancer Neg Hx    Bladder Cancer Neg Hx     Social History:  reports that he has never smoked. He has never used smokeless tobacco. He reports that he does not drink alcohol and does not use drugs.  ROS: For pertinent review of systems please refer to history of present illness  Physical Exam: BP (!) 161/106   Pulse 66   Ht 5\' 11"  (1.803 m)   Wt 191 lb (86.6 kg)   BMI 26.64 kg/m   Constitutional:  Well nourished. Alert and oriented, No acute distress. HEENT: [place] AT, mask in place.  Trachea midline Cardiovascular: No clubbing, cyanosis, or edema. Respiratory: Normal respiratory effort, no increased work of breathing. Neurologic: Grossly intact, no focal deficits, moving all 4 extremities. Psychiatric: Normal mood and affect.   Laboratory Data: Urinalysis Results for orders placed or performed during the hospital encounter of 03/08/21  Urinalysis, Complete w Microscopic  Result Value Ref Range   Color, Urine RED (A) YELLOW   APPearance TURBID (A) CLEAR   Specific Gravity, Urine 1.015 1.005 - 1.030   pH  5.0 5.0 - 8.0   Glucose, UA (A) NEGATIVE mg/dL    TEST NOT REPORTED DUE TO COLOR INTERFERENCE OF URINE PIGMENT   Hgb urine dipstick (A) NEGATIVE    TEST NOT REPORTED DUE TO COLOR INTERFERENCE OF URINE PIGMENT   Bilirubin Urine (A) NEGATIVE    TEST NOT REPORTED DUE TO COLOR INTERFERENCE OF URINE PIGMENT   Ketones, ur (A) NEGATIVE mg/dL    TEST NOT REPORTED DUE TO COLOR INTERFERENCE OF URINE PIGMENT   Protein, ur (A) NEGATIVE mg/dL    TEST NOT REPORTED DUE TO COLOR INTERFERENCE  OF URINE PIGMENT   Nitrite (A) NEGATIVE    TEST NOT REPORTED DUE TO COLOR INTERFERENCE OF URINE PIGMENT   Leukocytes,Ua (A) NEGATIVE    TEST NOT REPORTED DUE TO COLOR INTERFERENCE OF URINE PIGMENT   Squamous Epithelial / LPF 0-5 0 - 5   WBC, UA 0-5 0 - 5 WBC/hpf   RBC / HPF >50 0 - 5 RBC/hpf   Bacteria, UA RARE (A) NONE SEEN  I have reviewed the labs.  Pertinent Imaging N/A  Assessment & Plan:    1. Gross hematuria - I explained to the patient that there are a number of causes that can be associated with blood in the urine, such as stones, BPH, UTI's, damage to the urinary tract and/or cancer. -at this time, they are in a high risk stratification for hematuria per AUA guidelines due to their age (>60 years) and gross heme  -recommended studies for high risk are CT urogram and cysto - I explained to the patient that a contrast material will be injected into a vein and that in rare instances, an allergic reaction can result and may even life threatening (1:100,000)  The patient denies any allergies to contrast, iodine and/or seafood and is not taking metformin. - Following the imaging study,  I've recommended a cystoscopy. I described how this is performed, typically in an office setting with a flexible cystoscope. We described the risks, benefits, and possible side effects, the most common of which is a minor amount of blood in the urine and/or burning which usually resolves in 24 to 48 hours.   - The patient had  the opportunity to ask questions which were answered. Based upon this discussion, the patient is willing to proceed. Therefore, I've ordered: a CT Urogram and cystoscopy. - The patient will return following all of the above for discussion of the results.  - UA - Urine culture -stop ASA  2. Prostate cancer -PSA stable  3. BPH with LU TS -prostatomegaly seen on recent CT 08/2020 -no bothersome symptoms    Return for CT Urogram report and cystoscopy for gross hematuria in [facility] any provider .  These notes generated with voice recognition software. I apologize for typographical errors.  Zara Council, PA-C  [facility] [address] [place], [place] [address] [phone]

## 2021-03-09 ENCOUNTER — Ambulatory Visit
Admission: RE | Admit: 2021-03-09 | Discharge: 2021-03-09 | Disposition: A | Discharge status: Home / Self Care | Payer: Medicare Other | Source: Ambulatory Visit | Attending: Urology | Admitting: Urology

## 2021-03-09 DIAGNOSIS — R310 Gross hematuria: Secondary | ICD-10-CM | POA: Diagnosis not present

## 2021-03-09 DIAGNOSIS — R319 Hematuria, unspecified: Secondary | ICD-10-CM | POA: Diagnosis not present

## 2021-03-09 DIAGNOSIS — K449 Diaphragmatic hernia without obstruction or gangrene: Secondary | ICD-10-CM | POA: Diagnosis not present

## 2021-03-09 DIAGNOSIS — K439 Ventral hernia without obstruction or gangrene: Secondary | ICD-10-CM | POA: Diagnosis not present

## 2021-03-09 DIAGNOSIS — K4020 Bilateral inguinal hernia, without obstruction or gangrene, not specified as recurrent: Secondary | ICD-10-CM | POA: Diagnosis not present

## 2021-03-09 LAB — POCT I-STAT CREATININE: Creatinine, Ser: 0.9 mg/dL (ref 0.61–1.24)

## 2021-03-09 LAB — URINE CULTURE: Culture: <10000 — AB

## 2021-03-09 MED ORDER — IOHEXOL 350 MG/ML SOLN
125.0000 mL | Freq: Once | INTRAVENOUS | Status: AC | PRN
  Administered 2021-03-09: 125 mL via INTRAVENOUS

## 2021-03-10 NOTE — Progress Notes (Signed)
   03/11/2021   CC:  Chief Complaint  Patient presents with   Cysto    HPI: Alexander Woods is a 75 y.o. male with a personal history of prostate cancer, BPH with LUTS, and high risk hematuria, who presents today for cystoscopy and CT urogram report.   He is s/p TURP on 01/17/2007 by Dr. Dolores Lory with pathology of focal adenocarcinoma, Gleason 2+3.   During in office visit on 03/08/2021 with Zara Council, PA, he was experiencing gross hematuria since 03/07/2021. CT hematuria work-up revealed no hydronephrosis or nephrolithiasis and no suspicious filling defects of the renal collecting systems.  He is doing well today with no new urinary symptoms he is accompanied by his wife. He reports today that he has hernias. He reports that he has started to exercise more rigorously and has lost 15 lbs.    Vitals:   03/11/21 0829  BP: (!) 158/91  Pulse: 70   NED. A&Ox3.   No respiratory distress   Abd soft, NT, ND Normal phallus with bilateral descended testicles  Cystoscopy Procedure Note  Patient identification was confirmed, informed consent was obtained, and patient was prepped using Betadine solution.  Lidocaine jelly was administered per urethral meatus.     Pre-Procedure: - Inspection reveals a normal caliber ureteral meatus.  Procedure: The flexible cystoscope was introduced without difficulty - No urethral strictures/lesions are present. - Enlarged prostate  irregular prostatic fossa with nodular regrowth and bullous edema on the prostatic fossa closer to the bladder neck  - Asymmetrical prostate -Significant superficial vascularity of prostatic mucosa, bleeding with manipulation appreciated - Bilateral ureteral orifices identified - Bladder mucosa  reveals no ulcers, tumors, or lesions - No bladder stones - Mild trabeculation  Retroflexion shows TURP defect but with nodular regrowth imbedded in it    Post-Procedure: - Patient tolerated the procedure  well  Assessment/ Plan:  Gross hematuria  - CT urogram was negative  - Cystoscopy today revealed with pliable prostatic mucosa  with bullous  edema  likely cause of hematuria - Prescribed Finasteride to shrink blood vessels in prostate    Follow-up in 6 months with Zara Council, PA-C  I,Kailey Littlejohn,acting as a scribe for Hollice Espy, MD.,have documented all relevant documentation on the behalf of Hollice Espy, MD,as directed by  Hollice Espy, MD while in the presence of Hollice Espy, MD.  I have reviewed the above documentation for accuracy and completeness, and I agree with the above.   Hollice Espy, MD

## 2021-03-11 ENCOUNTER — Ambulatory Visit (INDEPENDENT_AMBULATORY_CARE_PROVIDER_SITE_OTHER): Payer: Medicare Other | Admitting: Urology

## 2021-03-11 ENCOUNTER — Other Ambulatory Visit: Payer: Self-pay

## 2021-03-11 VITALS — BP 158/91 | HR 70 | Ht 71.0 in | Wt 183.4 lb

## 2021-03-11 DIAGNOSIS — R310 Gross hematuria: Secondary | ICD-10-CM | POA: Diagnosis not present

## 2021-03-11 LAB — URINALYSIS, COMPLETE
Bilirubin, UA: NEGATIVE
Glucose, UA: NEGATIVE
Ketones, UA: NEGATIVE
Leukocytes,UA: NEGATIVE
Nitrite, UA: NEGATIVE
RBC, UA: NEGATIVE
Specific Gravity, UA: 1.02 (ref 1.005–1.030)
Urobilinogen, Ur: 0.2 mg/dL (ref 0.2–1.0)
pH, UA: 5.5 (ref 5.0–7.5)

## 2021-03-11 LAB — MICROSCOPIC EXAMINATION: Bacteria, UA: NONE SEEN

## 2021-03-11 MED ORDER — FINASTERIDE 5 MG PO TABS: 5.0000 mg | ORAL_TABLET | Freq: Every day | ORAL | 3 refills | Status: DC

## 2021-09-08 ENCOUNTER — Other Ambulatory Visit: Payer: Self-pay | Admitting: *Deleted

## 2021-09-08 DIAGNOSIS — C61 Malignant neoplasm of prostate: Secondary | ICD-10-CM

## 2021-09-09 ENCOUNTER — Other Ambulatory Visit: Payer: Medicare Other

## 2021-09-09 DIAGNOSIS — C61 Malignant neoplasm of prostate: Secondary | ICD-10-CM

## 2021-09-10 LAB — PSA: Prostate Specific Ag, Serum: 0.9 ng/mL (ref 0.0–4.0)

## 2021-09-13 ENCOUNTER — Telehealth: Payer: Self-pay | Admitting: *Deleted

## 2021-09-13 NOTE — Telephone Encounter (Addendum)
Patient informed, voiced understanding.  ? ? ? ?----- Message from Hollice Espy, MD sent at 09/13/2021 11:40 AM EDT ----- ?PSA has trended downwards as anticipated with the initiation of finasteride.  Great news. ? ?Hollice Espy, MD ? ?

## 2021-09-15 NOTE — Progress Notes (Signed)
? ? ? ?09/16/2021 ?8:47 AM  ? ?Alexander Woods ?15-Dec-1945 ?[id number] ? ?Referring provider: No referring provider defined for this encounter. ? ?Chief Complaint  ?Patient presents with  ? Benign Prostatic Hypertrophy  ? Prostate Cancer  ? ?Urological history: ?1. Prostate cancer ?-PSA 0.9 09/2021 ?-TURP on 01/17/2007 by Dr. Tana Conch Mynatt at [facility] and 2 chips returned with focal adenocarcinoma, Gleason 2 + 3=5 ? ?2. BPH with LU TS ?-cysto 2022 - Enlarged prostate  irregular prostatic fossa with nodular regrowth and bullous edema on the prostatic fossa closer to the bladder neck - Asymmetrical prostate -Significant superficial vascularity of prostatic mucosa, bleeding with manipulation appreciated - Mild trabeculation ?-I PSS 1/0 ?-Managed with finasteride 5 mg daily ? ?3. High risk hematuria ?-non-smoker ?-CTU 2022 - NED  ?-cysto 2022 - pliable prostatic mucosa with bullous edema  ?-No reports of gross hematuria ? ?HPI: ?Alexander Woods is a 76 y.o. male who presents today for 73-monthfollow-up. ? ?When last seen patient was having episodes of gross hematuria.  The source of the hematuria is found to be his prostate, so he was placed on finasteride 5 mg daily. ? ?Patient denies any modifying or aggravating factors.  Patient denies any gross hematuria, dysuria or suprapubic/flank pain.  Patient denies any fevers, chills, nausea or vomiting.   ? ? IPSS   ? ? [place] 09/16/21 0800  ?  ?  ?  ? International Prostate Symptom Score  ? How often have you had the sensation of not emptying your bladder? Not at All    ? How often have you had to urinate less than every two hours? Not at All    ? How often have you found you stopped and started again several times when you urinated? Not at All    ? How often have you found it difficult to postpone urination? Not at All    ? How often have you had a weak urinary stream? Not at All    ? How often have you had to strain to start urination? Not at All    ? How  many times did you typically get up at night to urinate? 1 Time    ? Total IPSS Score 1    ?  ? Quality of Life due to urinary symptoms  ? If you were to spend the rest of your life with your urinary condition just the way it is now how would you feel about that? Delighted    ? ?  ?  ? ?  ? ? ?Score:  ?1-7 Mild ?8-19 Moderate ?20-35 Severe  ? ? ?PMH: ?Past Medical History:  ?Diagnosis Date  ? Arthritis   ? BPH with obstruction/lower urinary tract symptoms   ? [facility] ([facility]   ? Cataract   ? DDD (degenerative disc disease), lumbar   ? DJD (degenerative joint disease)   ? GERD (gastroesophageal reflux disease)   ? Heartburn   ? HH (hiatus hernia)   ? Hypogonadism in male   ? Leg cramps   ? [place]state cancer ([place]   ? Sleep apnea   ? patient denies  ? ? ?Surgical History: ?Past Surgical History:  ?Procedure Laterality Date  ? [place]OUR [place] 04/15/2015  ? Procedure: [place];  Surgeon: MJosefine Class MD;  [facility]  Service: Endoscopy;  Laterality: N/A;  ? APPENDECTOMY    ? bLERETHEPLASTY    ? ESOPHAGEAL MANOMETRY N/A 04/15/2015  ? Procedure:  ESOPHAGEAL MANOMETRY (EM);  Surgeon: Josefine Class, MD;  Location: [facility] ENDOSCOPY;  Service: Endoscopy;  Laterality: N/A;  ? ESOPHAGOGASTRODUODENOSCOPY (EGD) WITH PROPOFOL N/A 02/12/2015  ? Procedure: ESOPHAGOGASTRODUODENOSCOPY (EGD) WITH PROPOFOL;  Surgeon: Josefine Class, MD;  Location: [facility] ENDOSCOPY;  Service: Endoscopy;  Laterality: N/A;  ? INCISION TENDON SHEATH FOR TRIGGER FINGER    ? PARTIAL KNEE ARTHROPLASTY Right 12/17/2015  ? Procedure: UNICOMPARTMENTAL KNEE;  Surgeon: Corky Mull, MD;  Location: [facility] ORS;  Service: Orthopedics;  Laterality: Right;  ? PARTIAL KNEE ARTHROPLASTY Left 04/07/2016  ? Procedure: UNICOMPARTMENTAL KNEE/ REVISION POLYETHYLENE;  Surgeon: Corky Mull, MD;  Location: [facility] ORS;  Service: Orthopedics;  Laterality: Left;  ? PLANTAR FASCITIS     ? PROSTATE SURGERY    ? TONSILLECTOMY    ? TOTAL KNEE REVISION Right  06/07/2016  ? Procedure: TOTAL KNEE REVISION;  Surgeon: Corky Mull, MD;  Location: [facility] ORS;  Service: Orthopedics;  Laterality: Right;  ? TURP VAPORIZATION    ? ? ?Home Medications:  ?Allergies as of 09/16/2021   ? ?   Reactions  ? Bacitracin Rash  ? Bee Venom Anaphylaxis  ? Ciprofloxacin Anaphylaxis  ? Erythromycin Ethylsuccinate Rash  ? Monosodium Glutamate Anaphylaxis  ? Fentanyl   ? Heart rate dropped to 30 beats per minute and caused convulsions.  ? Enoxaparin Rash  ? ?  ? ?  ?Medication List  ?  ? ?  ? Accurate as of September 16, 2021  8:47 AM. If you have any questions, ask your nurse or doctor.  ?  ?  ? ?  ? ?ascorbic acid 1000 MG tablet ?Commonly known as: VITAMIN C ?Take by mouth. ?  ?calcium-vitamin D 500-200 MG-UNIT tablet ?Commonly known as: OSCAL WITH D ?Take 1 tablet by mouth daily with breakfast. Reported on 10/13/2015 ?  ?CINNAMON PO ?Take by mouth. ?  ?EPINEPHrine 0.3 mg/0.3 mL Soaj injection ?Commonly known as: EPI-PEN ?Inject 0.3 mg into the muscle once. ?  ?finasteride 5 MG tablet ?Commonly known as: PROSCAR ?Take 1 tablet (5 mg total) by mouth daily. ?  ?glucosamine-chondroitin 500-400 MG tablet ?Take 2 tablets by mouth once. ?  ?Multi-Vitamins Tabs ?Take 1 tablet by mouth daily. ?  ?Oyster Shell Calcium 500-400 MG-UNIT Tabs ?Take by mouth. ?  ?pantoprazole 40 MG tablet ?Commonly known as: PROTONIX ?Take 40 mg by mouth daily as needed. Reported on 10/13/2015 ?  ?Potassium Gluconate 595 MG Caps ?Take 1 capsule by mouth daily. ?  ?Vitamin D3 125 MCG (5000 UT) Caps ?Take by mouth daily. ?  ? ?  ? ? ?Allergies:  ?Allergies  ?Allergen Reactions  ? Bacitracin Rash  ? Bee Venom Anaphylaxis  ? Ciprofloxacin Anaphylaxis  ? Erythromycin Ethylsuccinate Rash  ? Monosodium Glutamate Anaphylaxis  ? Fentanyl   ?  Heart rate dropped to 30 beats per minute and caused convulsions.  ? Enoxaparin Rash  ? ? ?Family History: ?Family History  ?Problem Relation Age of Onset  ? Stroke Father   ? Heart disease Mother   ?      Aortic Tear  ? Heart failure Mother   ? Kidney disease Neg Hx   ? Prostate cancer Neg Hx   ? Bladder Cancer Neg Hx   ? ? ?Social History:  reports that he has never smoked. He has never used smokeless tobacco. He reports that he does not drink alcohol and does not use drugs. ? ?ROS: ?For pertinent review of systems please refer to history of  present illness ? ?Physical Exam: ?BP (!) 141/94   Pulse 72   Ht _0  (1.803 m)   Wt 189 lb (85.7 kg)   BMI 26.36 kg/m?   ?Constitutional:  Well nourished. Alert and oriented, No acute distress. ?HEENT: [place] AT, moist mucus membranes.  Trachea midline ?Cardiovascular: No clubbing, cyanosis, or edema. ?Respiratory: Normal respiratory effort, no increased work of breathing. ?GU: No CVA tenderness.  No bladder fullness or masses.  Patient with circumcised phallus.  Urethral meatus is patent.  No penile discharge. No penile lesions or rashes. Scrotum without lesions, cysts, rashes and/or edema.  Testicles are located scrotally bilaterally. No masses are appreciated in the testicles. Left and right epididymis are normal. ?Rectal: Patient with  normal sphincter tone. Anus and perineum without scarring or rashes. No rectal masses are appreciated. Prostate is approximately 60 grams, no nodules are appreciated. Seminal vesicles could not be palpated ?Neurologic: Grossly intact, no focal deficits, moving all 4 extremities. ?Psychiatric: Normal mood and affect.  ? ?Laboratory Data: ?Apr 27, 2021 09:39 AM [facility] HGB A1C Specimen Type: BLOOD ?No comment entered. ?Ordering Provider: Ree Kida ?Report Released Date/Time: Apr 27, 2021 09:09 AM ?Reporting Lab: [place] [id number] ?Performing Lab: [place] [id number]  ?    HGB A1C 5.9    4.1-6.5    ?Apr 27, 2021 09:39 AM [place] LIPID PROFILE Specimen Type: PLASMA ?Comment:  Estimated Glomerular Filtration Rate (eGFR) calculated using the 2021 Chronic Kidney Disease-Epidemiology (CKD-EPI) Collaboration creatinine equation; units of measure are mL/min/1.73 m2. Results are only v

## 2021-09-16 ENCOUNTER — Encounter: Payer: Self-pay | Admitting: Urology

## 2021-09-16 ENCOUNTER — Ambulatory Visit (INDEPENDENT_AMBULATORY_CARE_PROVIDER_SITE_OTHER): Payer: Medicare Other | Admitting: Urology

## 2021-09-16 ENCOUNTER — Other Ambulatory Visit: Payer: Self-pay

## 2021-09-16 VITALS — BP 141/94 | HR 72 | Ht 71.0 in | Wt 189.0 lb

## 2021-09-16 DIAGNOSIS — N138 Other obstructive and reflux uropathy: Secondary | ICD-10-CM | POA: Diagnosis not present

## 2021-09-16 DIAGNOSIS — R319 Hematuria, unspecified: Secondary | ICD-10-CM | POA: Diagnosis not present

## 2021-09-16 DIAGNOSIS — N401 Enlarged prostate with lower urinary tract symptoms: Secondary | ICD-10-CM | POA: Diagnosis not present

## 2021-09-16 DIAGNOSIS — C61 Malignant neoplasm of prostate: Secondary | ICD-10-CM | POA: Diagnosis not present

## 2021-09-16 DIAGNOSIS — N529 Male erectile dysfunction, unspecified: Secondary | ICD-10-CM

## 2021-10-05 DIAGNOSIS — Z961 Presence of intraocular lens: Secondary | ICD-10-CM | POA: Diagnosis not present

## 2021-10-05 DIAGNOSIS — H43813 Vitreous degeneration, bilateral: Secondary | ICD-10-CM | POA: Diagnosis not present

## 2021-10-05 DIAGNOSIS — Z9889 Other specified postprocedural states: Secondary | ICD-10-CM | POA: Diagnosis not present

## 2022-02-15 ENCOUNTER — Other Ambulatory Visit: Payer: Self-pay | Admitting: Urology

## 2022-02-15 MED ORDER — FINASTERIDE 5 MG PO TABS: 5.0000 mg | ORAL_TABLET | Freq: Every day | ORAL | 3 refills | Status: DC

## 2022-03-08 DIAGNOSIS — Z23 Encounter for immunization: Secondary | ICD-10-CM | POA: Diagnosis not present

## 2022-09-15 ENCOUNTER — Other Ambulatory Visit: Payer: Medicare Other

## 2022-09-15 DIAGNOSIS — N529 Male erectile dysfunction, unspecified: Secondary | ICD-10-CM | POA: Diagnosis not present

## 2022-09-15 DIAGNOSIS — C61 Malignant neoplasm of prostate: Secondary | ICD-10-CM

## 2022-09-16 LAB — PSA: Prostate Specific Ag, Serum: 0.8 ng/mL (ref 0.0–4.0)

## 2022-09-16 LAB — TESTOSTERONE: Testosterone: 743 ng/dL (ref 264–916)

## 2022-09-19 NOTE — Progress Notes (Unsigned)
09/20/2022 8:42 AM   Alexander Woods [birth year] [id number]  Referring provider: No referring provider defined for this encounter.  Chief Complaint  Patient presents with   Prostate Cancer   Urological history: 1. Prostate cancer -PSA (09/2022) 0.8 -TURP on 01/17/2007 by Dr. Herschell Dimes Mynatt at [facility] and 2 chips returned with focal adenocarcinoma, Gleason 2 + 3=5  2. BPH with LU TS -cysto 2022 - Enlarged prostate  irregular prostatic fossa with nodular regrowth and bullous edema on the prostatic fossa closer to the bladder neck - Asymmetrical prostate -Significant superficial vascularity of prostatic mucosa, bleeding with manipulation appreciated - Mild trabeculation -finasteride 5 mg daily  3. High risk hematuria -non-smoker -CTU 2022 - NED  -cysto 2022 - pliable prostatic mucosa with bullous edema   HPI: Alexander Woods is a 77 y.o. male who presents today for 69-month follow-up.  At his last follow-up he wanted his testosterone level checked as well as his PSA as he was taking a over-the-counter supplement.  His PSA has returned at 0.8.  His testosterone is 743.  I PSS 3/0  He has no urinary complaints at this visit.  He has nocturia x 1 on occasion.  Patient denies any modifying or aggravating factors.  Patient denies any gross hematuria, dysuria or suprapubic/flank pain.  Patient denies any fevers, chills, nausea or vomiting.     IPSS     Row Name 09/20/22 0800         International Prostate Symptom Score   How often have you had the sensation of not emptying your bladder? Not at All     How often have you had to urinate less than every two hours? Less than 1 in 5 times     How often have you found you stopped and started again several times when you urinated? Not at All     How often have you found it difficult to postpone urination? Less than 1 in 5 times     How often have you had a weak urinary stream? Not at All     How often have you had  to strain to start urination? Not at All     How many times did you typically get up at night to urinate? 1 Time     Total IPSS Score 3       Quality of Life due to urinary symptoms   If you were to spend the rest of your life with your urinary condition just the way it is now how would you feel about that? Delighted              Score:  1-7 Mild 8-19 Moderate 20-35 Severe    PMH: Past Medical History:  Diagnosis Date   Arthritis    BPH with obstruction/lower urinary tract symptoms    Cancer    Cataract    DDD (degenerative disc disease), lumbar    DJD (degenerative joint disease)    GERD (gastroesophageal reflux disease)    Heartburn    HH (hiatus hernia)    Hypogonadism in male    Leg cramps    Prostate cancer    Sleep apnea    patient denies    Surgical History: Past Surgical History:  Procedure Laterality Date   54 HOUR PH STUDY N/A 04/15/2015   Procedure: 24 HOUR PH STUDY;  Surgeon: Elnita Maxwell, MD;  Location: [facility] ENDOSCOPY;  Service: Endoscopy;  Laterality: N/A;   APPENDECTOMY  bLERETHEPLASTY     ESOPHAGEAL MANOMETRY N/A 04/15/2015   Procedure: ESOPHAGEAL MANOMETRY (EM);  Surgeon: Elnita Maxwell, MD;  Location: [facility] ENDOSCOPY;  Service: Endoscopy;  Laterality: N/A;   ESOPHAGOGASTRODUODENOSCOPY (EGD) WITH PROPOFOL N/A 02/12/2015   Procedure: ESOPHAGOGASTRODUODENOSCOPY (EGD) WITH PROPOFOL;  Surgeon: Elnita Maxwell, MD;  Location: [facility] ENDOSCOPY;  Service: Endoscopy;  Laterality: N/A;   INCISION TENDON SHEATH FOR TRIGGER FINGER     PARTIAL KNEE ARTHROPLASTY Right 12/17/2015   Procedure: UNICOMPARTMENTAL KNEE;  Surgeon: Christena Flake, MD;  Location: [facility] ORS;  Service: Orthopedics;  Laterality: Right;   PARTIAL KNEE ARTHROPLASTY Left 04/07/2016   Procedure: UNICOMPARTMENTAL KNEE/ REVISION POLYETHYLENE;  Surgeon: Christena Flake, MD;  Location: [facility] ORS;  Service: Orthopedics;  Laterality: Left;   PLANTAR FASCITIS      PROSTATE SURGERY      TONSILLECTOMY     TOTAL KNEE REVISION Right 06/07/2016   Procedure: TOTAL KNEE REVISION;  Surgeon: Christena Flake, MD;  Location: [facility] ORS;  Service: Orthopedics;  Laterality: Right;   TURP VAPORIZATION      Home Medications:  Allergies as of 09/20/2022       Reactions   Bacitracin Rash   Bee Venom Anaphylaxis   Ciprofloxacin Anaphylaxis   Erythromycin Ethylsuccinate Rash   Monosodium Glutamate Anaphylaxis   Fentanyl    Heart rate dropped to 30 beats per minute and caused convulsions.   Enoxaparin Rash        Medication List        Accurate as of September 20, 2022  8:42 AM. If you have any questions, ask your nurse or doctor.          STOP taking these medications    calcium-vitamin D 500-200 MG-UNIT tablet Commonly known as: OSCAL WITH D Stopped by: Michiel Cowboy, PA-C   Oyster Shell Calcium 500-400 MG-UNIT Tabs Stopped by: Joelle Flessner, PA-C   pantoprazole 40 MG tablet Commonly known as: PROTONIX Stopped by: Chevonne Bostrom, PA-C       TAKE these medications    ascorbic acid 1000 MG tablet Commonly known as: VITAMIN C Take by mouth.   CINNAMON PO Take by mouth.   EPINEPHrine 0.3 mg/0.3 mL Soaj injection Commonly known as: EPI-PEN Inject 0.3 mg into the muscle once.   finasteride 5 MG tablet Commonly known as: PROSCAR Take 1 tablet (5 mg total) by mouth daily.   glucosamine-chondroitin 500-400 MG tablet Take 2 tablets by mouth once.   Multi-Vitamins Tabs Take 1 tablet by mouth daily.   Vitamin D3 125 MCG (5000 UT) Caps Take by mouth daily.        Allergies:  Allergies  Allergen Reactions   Bacitracin Rash   Bee Venom Anaphylaxis   Ciprofloxacin Anaphylaxis   Erythromycin Ethylsuccinate Rash   Monosodium Glutamate Anaphylaxis   Fentanyl     Heart rate dropped to 30 beats per minute and caused convulsions.   Enoxaparin Rash    Family History: Family History  Problem Relation Age of Onset   Stroke Father    Heart disease Mother         Aortic Tear   Heart failure Mother    Kidney disease Neg Hx    Prostate cancer Neg Hx    Bladder Cancer Neg Hx     Social History:  reports that he has never smoked. He has never used smokeless tobacco. He reports that he does not drink alcohol and does not use drugs.  ROS: For pertinent review of  systems please refer to history of present illness  Physical Exam: BP (!) 144/99   Pulse 80   Ht 5\' 11"  (1.803 m)   Wt 181 lb 8 oz (82.3 kg)   BMI 25.31 kg/m   Constitutional:  Well nourished. Alert and oriented, No acute distress. HEENT: [place] AT, moist mucus membranes.  Trachea midline Cardiovascular: No clubbing, cyanosis, or edema. Respiratory: Normal respiratory effort, no increased work of breathing. Neurologic: Grossly intact, no focal deficits, moving all 4 extremities. Psychiatric: Normal mood and affect.   Laboratory Data: Results for orders placed or performed in visit on 09/15/22  Testosterone  Result Value Ref Range   Testosterone 743 264 - 916 ng/dL  PSA  Result Value Ref Range   Prostate Specific Ag, Serum 0.8 0.0 - 4.0 ng/mL   Hemoglobin A1c (04/2022) 5.7 Serum creatinine (04/2022) 0.933, eGFR 84 I have reviewed the labs.  Pertinent Imaging N/A  Assessment & Plan:    1.  High risk hematuria -Recent work-up in 2022 with CT urogram and cystoscopy found the prostate is likely the source of hematuria -No reports of gross hematuria -Continue finasteride  2. Prostate cancer -PSA stable  3. BPH with LUTS -PSA stable -symptoms - nocturia x 1  -continue conservative management, avoiding bladder irritants and timed voiding's -Continue finasteride 5 mg daily   Return in about 1 year (around 09/20/2023) for IPSS, PSA , testosterone.  These notes generated with voice recognition software. I apologize for typographical errors.  Michiel Cowboy, PA-C  [facility] [address] [place], [place] [address] [phone]

## 2022-09-20 ENCOUNTER — Encounter: Payer: Self-pay | Admitting: Urology

## 2022-09-20 ENCOUNTER — Ambulatory Visit (INDEPENDENT_AMBULATORY_CARE_PROVIDER_SITE_OTHER): Payer: Medicare Other | Admitting: Urology

## 2022-09-20 VITALS — BP 142/90 | HR 73 | Ht 71.0 in | Wt 181.5 lb

## 2022-09-20 DIAGNOSIS — R319 Hematuria, unspecified: Secondary | ICD-10-CM

## 2022-09-20 DIAGNOSIS — C61 Malignant neoplasm of prostate: Secondary | ICD-10-CM | POA: Diagnosis not present

## 2022-09-20 DIAGNOSIS — N138 Other obstructive and reflux uropathy: Secondary | ICD-10-CM

## 2022-09-20 DIAGNOSIS — N401 Enlarged prostate with lower urinary tract symptoms: Secondary | ICD-10-CM

## 2022-09-20 DIAGNOSIS — R351 Nocturia: Secondary | ICD-10-CM

## 2022-09-20 NOTE — Progress Notes (Signed)
Alexander Woods presents for an office/procedure visit. BP today is 09/20/2022. He complaint with BP medication. Greater than 140/90. Provider  notified. Pt advised to contact PCP. Pt voiced understanding.

## 2023-02-21 ENCOUNTER — Other Ambulatory Visit: Payer: Self-pay | Admitting: Urology

## 2023-02-21 DIAGNOSIS — Z961 Presence of intraocular lens: Secondary | ICD-10-CM | POA: Diagnosis not present

## 2023-02-21 DIAGNOSIS — H43813 Vitreous degeneration, bilateral: Secondary | ICD-10-CM | POA: Diagnosis not present

## 2023-03-24 DIAGNOSIS — Z23 Encounter for immunization: Secondary | ICD-10-CM | POA: Diagnosis not present

## 2023-06-01 ENCOUNTER — Ambulatory Visit
Admission: EM | Admit: 2023-06-01 | Discharge: 2023-06-01 | Disposition: A | Discharge status: Home / Self Care | Payer: Medicare Other | Attending: Emergency Medicine | Admitting: Emergency Medicine

## 2023-06-01 DIAGNOSIS — L03012 Cellulitis of left finger: Secondary | ICD-10-CM

## 2023-06-01 DIAGNOSIS — M79645 Pain in left finger(s): Secondary | ICD-10-CM

## 2023-06-01 DIAGNOSIS — M79644 Pain in right finger(s): Secondary | ICD-10-CM

## 2023-06-01 MED ORDER — DICLOFENAC SODIUM 1 % EX GEL: 2.0000 g | Freq: Four times a day (QID) | CUTANEOUS | 1 refills | Status: DC

## 2023-06-01 MED ORDER — DOXYCYCLINE HYCLATE 100 MG PO CAPS: 100.0000 mg | ORAL_CAPSULE | Freq: Two times a day (BID) | ORAL | 0 refills | Status: AC

## 2023-06-01 NOTE — ED Triage Notes (Signed)
Pt present left hand/middle finger infection. Pt finger is red and warm to the touch with swelling. Symptoms started on Sunday.

## 2023-06-01 NOTE — ED Provider Notes (Signed)
Alexander Woods    CSN: [id number] Arrival date & time: 06/01/23  1400      History   Chief Complaint Chief Complaint  Patient presents with   Nail Problem    HPI Alexander Woods is a 77 y.o. male.   As for evaluation of pain, swelling, warmth to the skin present to the left middle finger beginning 4 days ago.  Denies injury or trauma, recent nail grooming, drainage or fever.  Has been applying ice and using warm cough packs which have been somewhat helpful.  Patient concern for pain to the bottom of the bilateral thumbs present for 4 months, flaring intermittently.  Pain described as a dull but occasionally having sharp intensified pains depending on movement.  At times a difficulty to gripping.  Denies numbness or tingling.  Concerned with carpal tunnel or arthritis. Past Medical History:  Diagnosis Date   Arthritis    BPH with obstruction/lower urinary tract symptoms    Cancer (HCC)    Cataract    DDD (degenerative disc disease), lumbar    DJD (degenerative joint disease)    GERD (gastroesophageal reflux disease)    Heartburn    HH (hiatus hernia)    Hypogonadism in male    Leg cramps    Prostate cancer (HCC)    Sleep apnea    patient denies    Patient Active Problem List   Diagnosis Date Noted   Right knee pain 06/04/2016   Internal orthopedic device mechanical complication, initial encounter (HCC) 04/06/2016   Status post right partial knee replacement 12/17/2015   History of prostate cancer 10/13/2015   BPH with obstruction/lower urinary tract symptoms 10/13/2015   H/O nutritional disorder 03/31/2015   Degeneration of intervertebral disc of lumbar region 11/14/2014   Neuritis or radiculitis due to rupture of lumbar intervertebral disc 06/09/2014   Lumbar canal stenosis 06/09/2014   Low back pain 01/24/2014   Acid reflux 11/08/2013    Past Surgical History:  Procedure Laterality Date   24 HOUR PH STUDY N/A 04/15/2015   Procedure: 24 HOUR PH STUDY;   Surgeon: Elnita Maxwell, MD;  Location[facility] ENDOSCOPY;  Service: Endoscopy;  Laterality: N/A;   APPENDECTOMY     bLERETHEPLASTY     ESOPHAGEAL MANOMETRY N/A 04/15/2015   Procedure: ESOPHAGEAL MANOMETRY (EM);  Surgeon: Elnita Maxwell, MD;  Location: [facility] ENDOSCOPY;  Service: Endoscopy;  Laterality: N/A;   ESOPHAGOGASTRODUODENOSCOPY (EGD) WITH PROPOFOL N/A 02/12/2015   Procedure: ESOPHAGOGASTRODUODENOSCOPY (EGD) WITH PROPOFOL;  Surgeon: Elnita Maxwell, MD;  Location: [facility] ENDOSCOPY;  Service: Endoscopy;  Laterality: N/A;   INCISION TENDON SHEATH FOR TRIGGER FINGER     PARTIAL KNEE ARTHROPLASTY Right 12/17/2015   Procedure: UNICOMPARTMENTAL KNEE;  Surgeon: Christena Flake, MD;  Location: [facility] ORS;  Service: Orthopedics;  Laterality: Right;   PARTIAL KNEE ARTHROPLASTY Left 04/07/2016   Procedure: UNICOMPARTMENTAL KNEE/ REVISION POLYETHYLENE;  Surgeon: Christena Flake, MD;  Location: [facility] ORS;  Service: Orthopedics;  Laterality: Left;   PLANTAR FASCITIS      PROSTATE SURGERY     TONSILLECTOMY     TOTAL KNEE REVISION Right 06/07/2016   Procedure: TOTAL KNEE REVISION;  Surgeon: Christena Flake, MD;  Location: [facility] ORS;  Service: Orthopedics;  Laterality: Right;   TURP VAPORIZATION         Home Medications    Prior to Admission medications   Medication Sig Start Date End Date Taking? Authorizing Provider  diclofenac Sodium (VOLTAREN) 1 % GEL Apply 2 g  topically 4 (four) times daily. 06/01/23  Yes Aneliz Carbary, Elita Boone, NP  doxycycline (VIBRAMYCIN) 100 MG capsule Take 1 capsule (100 mg total) by mouth 2 (two) times daily. 06/01/23  Yes Jolee Critcher R, NP  ascorbic acid (VITAMIN C) 1000 MG tablet Take by mouth.    [provider]  Cholecalciferol (VITAMIN D3) 125 MCG (5000 UT) CAPS Take by mouth daily.     [provider]  CINNAMON PO Take by mouth.    [provider]  EPINEPHrine 0.3 mg/0.3 mL IJ SOAJ injection Inject 0.3 mg into the muscle once.    [provider]  finasteride (PROSCAR) 5 MG tablet TAKE 1 TABLET BY MOUTH ONCE DAILY 02/21/23   Michiel Cowboy A, PA-C  glucosamine-chondroitin 500-400 MG tablet Take 2 tablets by mouth once.     [provider]  Multiple Vitamin (MULTI-VITAMINS) TABS Take 1 tablet by mouth daily.     [provider]    Family History Family History  Problem Relation Age of Onset   Stroke Father    Heart disease Mother        Aortic Tear   Heart failure Mother    Kidney disease Neg Hx    Prostate cancer Neg Hx    Bladder Cancer Neg Hx     Social History Social History   Tobacco Use   Smoking status: Never   Smokeless tobacco: Never  Vaping Use   Vaping status: Never Used  Substance Use Topics   Alcohol use: No   Drug use: No     Allergies   Bacitracin, Bee venom, Ciprofloxacin, Erythromycin ethylsuccinate, Monosodium glutamate, Fentanyl, and Enoxaparin   Review of Systems Review of Systems   Physical Exam Triage Vital Signs ED Triage Vitals [06/01/23 1441]  Encounter Vitals Group     BP (!) 150/93     Systolic BP Percentile      Diastolic BP Percentile      Pulse Rate 87     Resp 18     Temp 98.2 F (36.8 C)     Temp Source Oral     SpO2 97 %     Weight      Height      Head Circumference      Peak Flow      Pain Score 7     Pain Loc      Pain Education      Exclude from Growth Chart    No data found.  Updated Vital Signs BP (!) 150/93 (BP Location: Right Arm)   Pulse 87   Temp 98.2 F (36.8 C) (Oral)   Resp 18   SpO2 97%   Visual Acuity Right Eye Distance:   Left Eye Distance:   Bilateral Distance:    Right Eye Near:   Left Eye Near:    Bilateral Near:     Physical Exam Constitutional:      Appearance: Normal appearance.  Eyes:     Extraocular Movements: Extraocular movements intact.  Pulmonary:     Effort: Pulmonary effort is normal.  Musculoskeletal:     Comments: Tenderness present at the bases of the first metacarpals  bilaterally, no swelling or deformity noted, able to complete full range of motion of the thumbs, 2+ pulses bilaterally, negative Phalen's and Tinel's, no tenderness noted to the median nerve  Skin:    Comments: Erythema, moderate swelling, tenderness noted to the distal and lateral aspect of the left middle finger nailbed, no  drainage or pus noted, capillary refill less than 3, sensation intact, 2+ radial pulse  Neurological:     Mental Status: He is alert and oriented to person, place, and time. Mental status is at baseline.      UC Treatments / Results  Labs (all labs ordered are listed, but only abnormal results are displayed) Labs Reviewed - No data to display  EKG   Radiology No results found.  Procedures Procedures (including critical care time)  Medications Ordered in UC Medications - No data to display  Initial Impression / Assessment and Plan / UC Course  I have reviewed the triage vital signs and the nursing notes.  Pertinent labs & imaging results that were available during my care of the patient were reviewed by me and considered in my medical decision making (see chart for details).  Paronychia of the left middle finger, bilateral thumb pain  Presentation of the left middle finger is consistent with infection, discussed with patient, prescribed doxycycline and recommended continue supportive care with follow-up with urgent care or PCP for delayed healing  Etiology of thumb pain is most likely arthritic, low suspicion for carpal tunnel as there is a negative Phalen's and Tinel's sign, discussed this with patient, would like to hold off on oral medicine therefore prescribed Voltaren gel, has been using [place] which she can continue, may follow-up with urgent care or PCP for further evaluation as needed Final Clinical Impressions(s) / UC Diagnoses   Final diagnoses:  Paronychia of left middle finger  Bilateral thumb pain     Discharge Instructions      Your  evaluated for your fingernail which is consistent with infection  The areas to the bottom of your thumbs are most likely arthritis, you may use Voltaren gel over the affected area every 6 hours as needed, may continue use of capsaicin  cream  Take doxycycline every morning and every evening for 10 days to clear out germs  May hold warm compresses or soak the finger in warm water to help reduce swelling and help manage pain  May take Tylenol every 6 hours as needed for comfort  Please follow-up with urgent care or their primary doctor for any concerns regarding healing   ED Prescriptions     Medication Sig Dispense Auth. Provider   doxycycline (VIBRAMYCIN) 100 MG capsule Take 1 capsule (100 mg total) by mouth 2 (two) times daily. 20 capsule Haskell Rihn R, NP   diclofenac Sodium (VOLTAREN) 1 % GEL Apply 2 g topically 4 (four) times daily. 100 g Valinda Hoar, NP      PDMP not reviewed this encounter.   Valinda Hoar, NP 06/01/23 1459

## 2023-06-01 NOTE — Discharge Instructions (Addendum)
Your evaluated for your fingernail which is consistent with infection  The areas to the bottom of your thumbs are most likely arthritis, you may use Voltaren gel over the affected area every 6 hours as needed, may continue use of capsaicin  cream  Take doxycycline every morning and every evening for 10 days to clear out germs  May hold warm compresses or soak the finger in warm water to help reduce swelling and help manage pain  May take Tylenol every 6 hours as needed for comfort  Please follow-up with urgent care or their primary doctor for any concerns regarding healing

## 2023-09-08 ENCOUNTER — Other Ambulatory Visit: Payer: Self-pay

## 2023-09-08 DIAGNOSIS — N529 Male erectile dysfunction, unspecified: Secondary | ICD-10-CM

## 2023-09-08 DIAGNOSIS — C61 Malignant neoplasm of prostate: Secondary | ICD-10-CM

## 2023-09-15 ENCOUNTER — Other Ambulatory Visit: Payer: Medicare Other

## 2023-09-15 DIAGNOSIS — N529 Male erectile dysfunction, unspecified: Secondary | ICD-10-CM | POA: Diagnosis not present

## 2023-09-15 DIAGNOSIS — C61 Malignant neoplasm of prostate: Secondary | ICD-10-CM

## 2023-09-16 LAB — TESTOSTERONE: Testosterone: 848 ng/dL (ref 264–916)

## 2023-09-16 LAB — PSA: Prostate Specific Ag, Serum: 0.9 ng/mL (ref 0.0–4.0)

## 2023-09-20 ENCOUNTER — Ambulatory Visit: Payer: Medicare Other | Admitting: Urology

## 2023-09-25 NOTE — Progress Notes (Signed)
 09/26/2023 3:59 PM   Alexander [name], Alexander [name] [id number]  Referring provider: Josepha Nickels, MD [address] [place],  [place] [address]  Urological history: 1. Prostate cancer -PSA (09/2023) 0.9 -TURP on 01/17/2007 by Dr. Ector Goltz Mynatt at [facility] and 2 chips returned with focal adenocarcinoma, Gleason 2 + 3=5  2. BPH with LU TS -cysto 2022 - Enlarged prostate  irregular prostatic fossa with nodular regrowth and bullous edema on the prostatic fossa closer to the bladder neck - Asymmetrical prostate -Significant superficial vascularity of prostatic mucosa, bleeding with manipulation appreciated - Mild trabeculation -finasteride  5 mg daily  3. High risk hematuria -non-smoker -CTU 2022 - NED  -cysto 2022 - pliable prostatic mucosa with bullous edema   Chief Complaint  Patient presents with   Follow-up    HPI: Alexander [name] is a 78 y.o. male who presents today for 41-month follow-up for prostate cancer.   Previous records reviewed.     I PSS 7/0  He has no urinary complaints at this time.  He is taking the finasteride  5 mg daily.  Patient denies any modifying or aggravating factors.  Patient denies any recent UTI's, gross hematuria, dysuria or suprapubic/flank pain.  Patient denies any fevers, chills, nausea or vomiting.     IPSS     Row Name 09/26/23 1500         International Prostate Symptom Score   How often have you had the sensation of not emptying your bladder? Almost always     How often have you had to urinate less than every two hours? Not at All     How often have you found you stopped and started again several times when you urinated? Not at All     How often have you found it difficult to postpone urination? Less than 1 in 5 times     How often have you had a weak urinary stream? Not at All     How often have you had to strain to start urination? Not at All     How many times did you typically get up at night to urinate? 1 Time      Total IPSS Score 7       Quality of Life due to urinary symptoms   If you were to spend the rest of your life with your urinary condition just the way it is now how would you feel about that? Delighted               Score:  1-7 Mild 8-19 Moderate 20-35 Severe    PMH: Past Medical History:  Diagnosis Date   Arthritis    BPH with obstruction/lower urinary tract symptoms    Cancer (HCC)    Cataract    DDD (degenerative disc disease), lumbar    DJD (degenerative joint disease)    GERD (gastroesophageal reflux disease)    Heartburn    HH (hiatus hernia)    Hypogonadism in male    Leg cramps    Prostate cancer (HCC)    Sleep apnea    patient denies    Surgical History: Past Surgical History:  Procedure Laterality Date   60 HOUR PH STUDY N/A 04/15/2015   Procedure: [name] HOUR PH STUDY;  Surgeon: Luella Sager, MD;  Location: [facility] ENDOSCOPY;  Service: Endoscopy;  Laterality: N/A;   APPENDECTOMY     bLERETHEPLASTY     ESOPHAGEAL MANOMETRY N/A 04/15/2015   Procedure: ESOPHAGEAL MANOMETRY (EM);  Surgeon:  Luella Sager, MD;  Location: [facility] ENDOSCOPY;  Service: Endoscopy;  Laterality: N/A;   ESOPHAGOGASTRODUODENOSCOPY (EGD) WITH PROPOFOL  N/A 02/12/2015   Procedure: ESOPHAGOGASTRODUODENOSCOPY (EGD) WITH PROPOFOL ;  Surgeon: Luella Sager, MD;  Location: [facility] ENDOSCOPY;  Service: Endoscopy;  Laterality: N/A;   INCISION TENDON SHEATH FOR TRIGGER FINGER     PARTIAL KNEE ARTHROPLASTY Right 12/17/2015   Procedure: UNICOMPARTMENTAL KNEE;  Surgeon: Elner Hahn, MD;  Location: [facility] ORS;  Service: Orthopedics;  Laterality: Right;   PARTIAL KNEE ARTHROPLASTY Left 04/07/2016   Procedure: UNICOMPARTMENTAL KNEE/ REVISION POLYETHYLENE;  Surgeon: Elner Hahn, MD;  Location: [facility] ORS;  Service: Orthopedics;  Laterality: Left;   PLANTAR FASCITIS      PROSTATE SURGERY     TONSILLECTOMY     TOTAL KNEE REVISION Right 06/07/2016   Procedure: TOTAL KNEE REVISION;  Surgeon: Elner Hahn, MD;  Location: [facility] ORS;  Service: Orthopedics;  Laterality: Right;   TURP VAPORIZATION      Home Medications:  Allergies as of 09/26/2023       Reactions   Bacitracin Rash   Bee Venom Anaphylaxis   Ciprofloxacin Anaphylaxis   Erythromycin Ethylsuccinate Rash   Monosodium Glutamate Anaphylaxis   Fentanyl     Heart rate dropped to 30 beats per minute and caused convulsions.   Enoxaparin  Rash        Medication List        Accurate as of September 26, 2023  3:59 PM. If you have any questions, ask your nurse or doctor.          STOP taking these medications    CINNAMON PO Stopped by: Cathleen Coach Nyna Chilton   diclofenac  Sodium 1 % Gel Commonly known as: Voltaren  Stopped by: Arthuro Canelo       TAKE these medications    ascorbic acid  1000 MG tablet Commonly known as: VITAMIN C  Take by mouth.   aspirin  EC 81 MG tablet Take by mouth.   doxycycline  100 MG capsule Commonly known as: VIBRAMYCIN  Take 1 capsule (100 mg total) by mouth 2 (two) times daily.   EPINEPHrine  0.3 mg/0.3 mL Soaj injection Commonly known as: EPI-PEN Inject 0.3 mg into the muscle once.   finasteride  5 MG tablet Commonly known as: PROSCAR  TAKE 1 TABLET BY MOUTH ONCE DAILY   glucosamine-chondroitin 500-400 MG tablet Take 2 tablets by mouth once.   magnesium  oxide 400 (240 Mg) MG tablet Commonly known as: MAG-OX Take 400 mg by mouth daily.   Multi-Vitamins Tabs Take 1 tablet by mouth daily.   Vitamin D3 125 MCG (5000 UT) Caps Take by mouth daily.        Allergies:  Allergies  Allergen Reactions   Bacitracin Rash   Bee Venom Anaphylaxis   Ciprofloxacin Anaphylaxis   Erythromycin Ethylsuccinate Rash   Monosodium Glutamate Anaphylaxis   Fentanyl      Heart rate dropped to 30 beats per minute and caused convulsions.   Enoxaparin  Rash    Family History: Family History  Problem Relation Age of Onset   Stroke Father    Heart disease Mother        Aortic Tear   Heart  failure Mother    Kidney disease Neg Hx    Prostate cancer Neg Hx    Bladder Cancer Neg Hx     Social History:  reports that he has never smoked. He has never used smokeless tobacco. He reports that he does not drink alcohol and does not use drugs.  ROS: For pertinent  review of systems please refer to history of present illness  Physical Exam: BP 128/85   Pulse 66   Ht 5\' 11"  (1.803 m)   Wt 183 lb (83 kg)   BMI 25.52 kg/m   Constitutional:  Well nourished. Alert and oriented, No acute distress. HEENT: [place] AT, moist mucus membranes.  Trachea midline Cardiovascular: No clubbing, cyanosis, or edema. Respiratory: Normal respiratory effort, no increased work of breathing. Neurologic: Grossly intact, no focal deficits, moving all 4 extremities. Psychiatric: Normal mood and affect.   Laboratory Data: Results for orders placed or performed in visit on 09/15/23  Testosterone    Collection Time: 09/15/23  8:10 AM  Result Value Ref Range   Testosterone  848 264 - 916 ng/dL  PSA   Collection Time: 09/15/23  8:10 AM  Result Value Ref Range   Prostate Specific Ag, Serum 0.9 0.0 - 4.0 ng/mL  I have reviewed the labs.  Pertinent Imaging N/A  Assessment & Plan:    1.  High risk hematuria -Recent work-up in 2022 with CT urogram and cystoscopy found the prostate is likely the source of hematuria -No reports of gross hematuria -Continue finasteride   2. Prostate cancer -PSA stable  3. BPH with LUTS -PSA stable -continue conservative management, avoiding bladder irritants and timed voiding's -Continue finasteride  5 mg daily   Return in about 1 year (around 09/25/2024) for testosterone , PSA,  I PSS .  These notes generated with voice recognition software. I apologize for typographical errors.  Matilde Son, PA-C  [facility] [address] [place], [place] [address] [phone]

## 2023-09-26 ENCOUNTER — Ambulatory Visit (INDEPENDENT_AMBULATORY_CARE_PROVIDER_SITE_OTHER): Payer: Self-pay | Admitting: Urology

## 2023-09-26 ENCOUNTER — Encounter: Payer: Self-pay | Admitting: Urology

## 2023-09-26 VITALS — BP 128/85 | HR 66 | Ht 71.0 in | Wt 183.0 lb

## 2023-09-26 DIAGNOSIS — R319 Hematuria, unspecified: Secondary | ICD-10-CM

## 2023-09-26 DIAGNOSIS — N138 Other obstructive and reflux uropathy: Secondary | ICD-10-CM | POA: Diagnosis not present

## 2023-09-26 DIAGNOSIS — N401 Enlarged prostate with lower urinary tract symptoms: Secondary | ICD-10-CM

## 2023-09-26 DIAGNOSIS — C61 Malignant neoplasm of prostate: Secondary | ICD-10-CM | POA: Diagnosis not present

## 2023-09-26 NOTE — Addendum Note (Signed)
 Addended by: Darlen Eglin on: 09/26/2023 04:03 PM   Modules accepted: Orders

## 2023-11-03 ENCOUNTER — Other Ambulatory Visit: Payer: Self-pay | Admitting: Orthopedic Surgery

## 2023-11-03 DIAGNOSIS — Z96659 Presence of unspecified artificial knee joint: Secondary | ICD-10-CM

## 2023-11-03 DIAGNOSIS — Z96651 Presence of right artificial knee joint: Secondary | ICD-10-CM | POA: Diagnosis not present

## 2023-11-03 DIAGNOSIS — T84038A Mechanical loosening of other internal prosthetic joint, initial encounter: Secondary | ICD-10-CM | POA: Diagnosis not present

## 2023-11-13 ENCOUNTER — Other Ambulatory Visit: Payer: Self-pay | Admitting: Orthopedic Surgery

## 2023-11-13 DIAGNOSIS — Z96659 Presence of unspecified artificial knee joint: Secondary | ICD-10-CM

## 2023-11-13 DIAGNOSIS — Z96651 Presence of right artificial knee joint: Secondary | ICD-10-CM

## 2023-11-13 DIAGNOSIS — T84038A Mechanical loosening of other internal prosthetic joint, initial encounter: Secondary | ICD-10-CM

## 2023-11-15 ENCOUNTER — Encounter
Admission: RE | Admit: 2023-11-15 | Discharge: 2023-11-15 | Disposition: A | Discharge status: Home / Self Care | Source: Ambulatory Visit | Attending: Orthopedic Surgery | Admitting: Orthopedic Surgery

## 2023-11-15 DIAGNOSIS — Z96659 Presence of unspecified artificial knee joint: Secondary | ICD-10-CM | POA: Insufficient documentation

## 2023-11-15 DIAGNOSIS — Z96651 Presence of right artificial knee joint: Secondary | ICD-10-CM | POA: Diagnosis not present

## 2023-11-15 DIAGNOSIS — T84038A Mechanical loosening of other internal prosthetic joint, initial encounter: Secondary | ICD-10-CM | POA: Diagnosis not present

## 2023-11-15 MED ORDER — TECHNETIUM TC 99M MEDRONATE IV KIT
20.0000 | PACK | Freq: Once | INTRAVENOUS | Status: AC | PRN
  Administered 2023-11-15: 21.05 via INTRAVENOUS

## 2023-12-01 DIAGNOSIS — Z96659 Presence of unspecified artificial knee joint: Secondary | ICD-10-CM | POA: Diagnosis not present

## 2023-12-01 DIAGNOSIS — T84038A Mechanical loosening of other internal prosthetic joint, initial encounter: Secondary | ICD-10-CM | POA: Diagnosis not present

## 2023-12-01 DIAGNOSIS — Z96651 Presence of right artificial knee joint: Secondary | ICD-10-CM | POA: Diagnosis not present

## 2023-12-01 DIAGNOSIS — M1811 Unilateral primary osteoarthritis of first carpometacarpal joint, right hand: Secondary | ICD-10-CM | POA: Diagnosis not present

## 2023-12-05 DIAGNOSIS — M180 Bilateral primary osteoarthritis of first carpometacarpal joints: Secondary | ICD-10-CM | POA: Diagnosis not present

## 2023-12-05 DIAGNOSIS — M19032 Primary osteoarthritis, left wrist: Secondary | ICD-10-CM | POA: Diagnosis not present

## 2023-12-05 DIAGNOSIS — M19031 Primary osteoarthritis, right wrist: Secondary | ICD-10-CM | POA: Diagnosis not present

## 2024-02-27 ENCOUNTER — Other Ambulatory Visit: Payer: Self-pay | Admitting: Urology

## 2024-02-28 DIAGNOSIS — Z961 Presence of intraocular lens: Secondary | ICD-10-CM | POA: Diagnosis not present

## 2024-02-28 DIAGNOSIS — H43813 Vitreous degeneration, bilateral: Secondary | ICD-10-CM | POA: Diagnosis not present

## 2024-03-07 DIAGNOSIS — Z23 Encounter for immunization: Secondary | ICD-10-CM | POA: Diagnosis not present

## 2024-09-18 ENCOUNTER — Other Ambulatory Visit

## 2024-09-25 ENCOUNTER — Ambulatory Visit: Admitting: Urology
